# Patient Record
Sex: Male | Born: 1980 | Race: Black or African American | Hispanic: No | Marital: Single | Smoking: Current every day smoker
Health system: Southern US, Community
[De-identification: ages and names within clinical notes are randomized; demographics above are authoritative.]

## PROBLEM LIST (undated history)

## (undated) DIAGNOSIS — S83106A Unspecified dislocation of unspecified knee, initial encounter: Secondary | ICD-10-CM

## (undated) HISTORY — PX: HERNIA REPAIR: SHX51

## (undated) HISTORY — PX: APPENDECTOMY: SHX54

---
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
## 2001-11-23 ENCOUNTER — Emergency Department (HOSPITAL_COMMUNITY): Admission: EM | Admit: 2001-11-23 | Discharge: 2001-11-23 | Payer: Self-pay | Admitting: Emergency Medicine

## 2002-07-22 ENCOUNTER — Emergency Department (HOSPITAL_COMMUNITY): Admission: EM | Admit: 2002-07-22 | Discharge: 2002-07-22 | Payer: Self-pay | Admitting: Emergency Medicine

## 2002-07-22 ENCOUNTER — Encounter: Payer: Self-pay | Admitting: Emergency Medicine

## 2003-05-15 ENCOUNTER — Emergency Department (HOSPITAL_COMMUNITY): Admission: EM | Admit: 2003-05-15 | Discharge: 2003-05-15 | Payer: Self-pay | Admitting: *Deleted

## 2005-05-31 ENCOUNTER — Emergency Department (HOSPITAL_COMMUNITY): Admission: EM | Admit: 2005-05-31 | Discharge: 2005-05-31 | Payer: Self-pay | Admitting: Emergency Medicine

## 2006-07-02 ENCOUNTER — Emergency Department (HOSPITAL_COMMUNITY): Admission: EM | Admit: 2006-07-02 | Discharge: 2006-07-02 | Payer: Self-pay | Admitting: Emergency Medicine

## 2006-07-25 ENCOUNTER — Emergency Department (HOSPITAL_COMMUNITY): Admission: EM | Admit: 2006-07-25 | Discharge: 2006-07-25 | Payer: Self-pay | Admitting: Emergency Medicine

## 2006-12-11 ENCOUNTER — Emergency Department (HOSPITAL_COMMUNITY): Admission: EM | Admit: 2006-12-11 | Discharge: 2006-12-11 | Payer: Self-pay | Admitting: *Deleted

## 2008-06-16 ENCOUNTER — Emergency Department (HOSPITAL_COMMUNITY): Admission: EM | Admit: 2008-06-16 | Discharge: 2008-06-16 | Payer: Self-pay | Admitting: Emergency Medicine

## 2009-02-26 ENCOUNTER — Emergency Department (HOSPITAL_COMMUNITY): Admission: EM | Admit: 2009-02-26 | Discharge: 2009-02-26 | Payer: Self-pay | Admitting: Emergency Medicine

## 2010-12-01 LAB — DIFFERENTIAL
Basophils Relative: 0
Eosinophils Absolute: 0.4
Lymphs Abs: 1.5
Monocytes Relative: 6
Neutro Abs: 10.7 — ABNORMAL HIGH
Neutrophils Relative %: 80 — ABNORMAL HIGH

## 2010-12-01 LAB — BASIC METABOLIC PANEL
CO2: 26
GFR calc Af Amer: >60
Glucose, Bld: 93
Potassium: 4

## 2010-12-01 LAB — CBC
MCHC: 33.7
MCV: 82.4
RDW: 14.9 — ABNORMAL HIGH
WBC: 13.4 — ABNORMAL HIGH

## 2011-05-12 ENCOUNTER — Encounter (HOSPITAL_COMMUNITY): Payer: Self-pay | Admitting: Emergency Medicine

## 2011-05-12 ENCOUNTER — Emergency Department (HOSPITAL_COMMUNITY)
Admission: EM | Admit: 2011-05-12 | Discharge: 2011-05-12 | Disposition: A | Discharge status: Home / Self Care | Payer: Self-pay | Attending: Emergency Medicine | Admitting: Emergency Medicine

## 2011-05-12 DIAGNOSIS — F17200 Nicotine dependence, unspecified, uncomplicated: Secondary | ICD-10-CM | POA: Insufficient documentation

## 2011-05-12 DIAGNOSIS — K089 Disorder of teeth and supporting structures, unspecified: Secondary | ICD-10-CM | POA: Insufficient documentation

## 2011-05-12 DIAGNOSIS — K0889 Other specified disorders of teeth and supporting structures: Secondary | ICD-10-CM

## 2011-05-12 HISTORY — DX: Unspecified dislocation of unspecified knee, initial encounter: S83.106A

## 2011-05-12 MED ORDER — CLINDAMYCIN HCL 150 MG PO CAPS: 450.0000 mg | ORAL_CAPSULE | Freq: Three times a day (TID) | ORAL | Status: DC

## 2011-05-12 MED ORDER — OXYCODONE-ACETAMINOPHEN 5-325 MG PO TABS: 1.0000 | ORAL_TABLET | Freq: Four times a day (QID) | ORAL | Status: AC | PRN

## 2011-05-12 NOTE — Discharge Instructions (Signed)
You have a dental injury. Use the resource guide listed below to help you find a dentist if you do not already have one to followup with. It is very important that you get evaluated by a dentist as soon as possible. Call tomorrow to schedule an appointment. Use your pain medication as prescribed and do not operate heavy machinery while on pain medication. Note that your pain medication contains acetaminophen (Tylenol) & its is not reccommended that you use additional acetaminophen (Tylenol) while taking this medication. Take your full course of antibiotics. Read the instructions below.  Eat a soft or liquid diet and rinse your mouth out after meals with warm water. You should see a dentist or return here at once if you have increased swelling, increased pain or uncontrolled bleeding from the site of your injury.   SEEK MEDICAL CARE IF:   You have increased pain not controlled with medicines.   You have swelling around your tooth, in your face or neck.   You have bleeding which starts, continues, or gets worse.   You have a fever >101  If you are unable to open your mouth  RESOURCE GUIDE  Dental Problems  Patients with Medicaid: [facility]                     [facility] [address]                                           [address] Phone:  [phone]                                                  Phone:  [phone]  If unable to pay or uninsured, contact:  [facility] or [facility] to become qualified for the adult dental clinic.  Chronic Pain Problems Contact [facility]  [phone] Patients need to be referred by their primary care doctor.  Insufficient Money for Medicine Contact United Way:  call "211" or [facility] 271-5999.  No Primary Care Doctor Call [facility]  832-8000 Other agencies that provide inexpensive medical care    [facility]  [phone]    [place]  [facility]  [phone]    [facility]  [phone]    [facility] Clinic  [phone]    [facility]  [phone]    [facility]  [phone]  Psychological Services [facility]  [phone] [facility]  [phone] [facility]   800 853-5163 (emergency services [phone])  Substance Abuse Resources [facility]  336-882-2125 [facility] 336-784-9470 [facility] 336-285-9073 [facility] 336-845-3988 Residential & Outpatient Substance Abuse Program  800-659-3381  Abuse/Neglect [place] Child Abuse Hotline (336) 641-3795 [place] [facility] Abuse Hotline 800-378-5315 (After Hours)  Emergency Shelter [facility] (336) 271-5985  [facility] (336) 275-9566 [facility] (704) 372-4663  MRSA Hotline #:   832-7006    [place] [facility]  [facility] [place]     [employer]                            [facility] [address] [place]                       [address]      [address]  [place]                                                [place]                            [place] Phone:  [phone]                                   Phone:  [phone]                 Phone:  [phone]  [facility] Phone:  [phone]  [place] Child Abuse Hotline (336) 342-1394 (336) 342-3537 (After Hours)    

## 2011-05-12 NOTE — ED Notes (Signed)
Pt has a broken/abcessed tooth in L back side of his mouth that has been increasing in pain since 3-4 days ago. States the pain is so severe he can't eat or sleep. Feels like left side of face is swelling.

## 2011-05-12 NOTE — ED Provider Notes (Signed)
History     CSN: [id number]  Arrival date & time 05/12/11  1623   First MD Initiated Contact with Patient 05/12/11 1655      Chief Complaint  Patient presents with  . Dental Problem    (Consider location/radiation/quality/duration/timing/severity/associated sxs/prior treatment) HPI Comments: Patient reports that his left lower molar tooth has been broken for months.  No injury or trauma.   Tooth pain worse over the past week.  He has taken Ibuprofen and Advil for pain, which has not helped.  He does not have a dentist.   Patient is a 31 y.o. male presenting with tooth pain. The history is provided by the patient.  Dental PainThe primary symptoms include mouth pain. Primary symptoms do not include dental injury or fever.  Additional symptoms include: gum swelling and gum tenderness. Additional symptoms do not include: purulent gums, trismus, facial swelling, trouble swallowing, drooling and ear pain.    Past Medical History  Diagnosis Date  . Dislocated knee     Past Surgical History  Procedure Date  . Hernia repair   . Appendectomy     No family history on file.  History  Substance Use Topics  . Smoking status: Current Everyday Smoker -- 0.5 packs/day  . Smokeless tobacco: Not on file  . Alcohol Use: No      Review of Systems  Constitutional: Negative for fever and chills.  HENT: Positive for dental problem. Negative for ear pain, facial swelling, drooling, trouble swallowing, neck pain, neck stiffness and voice change.   Gastrointestinal: Negative for nausea and vomiting.    Allergies  Amoxicillin  Home Medications   Current Outpatient Rx  Name Route Sig Dispense Refill  . IBUPROFEN 200 MG PO TABS Oral Take 800 mg by mouth every 6 (six) hours as needed. For pain      BP 153/89  Pulse 68  Temp(Src) 98 F (36.7 C) (Oral)  SpO2 100%  Physical Exam  Nursing note and vitals reviewed. Constitutional: He is oriented to person, place, and time. He appears  well-developed and well-nourished. No distress.  HENT:  Head: Normocephalic and atraumatic. No trismus in the jaw.  Mouth/Throat: Uvula is midline, oropharynx is clear and moist and mucous membranes are normal. Abnormal dentition. No dental abscesses or uvula swelling. No oropharyngeal exudate, posterior oropharyngeal edema, posterior oropharyngeal erythema or tonsillar abscesses.       Poor dental hygiene. Pt able to open and close mouth with out difficulty. Airway intact. Uvula midline. Mild gingival swelling with tenderness over affected area, but no fluctuance. No swelling or tenderness of submental and submandibular regions.  Eyes: Conjunctivae and EOM are normal.  Neck: Normal range of motion and full passive range of motion without pain. Neck supple.  Cardiovascular: Normal rate and regular rhythm.   Pulmonary/Chest: Effort normal and breath sounds normal. No stridor. No respiratory distress. He has no wheezes.  Musculoskeletal: Normal range of motion.  Lymphadenopathy:       Head (right side): No submental, no submandibular, no tonsillar, no preauricular and no posterior auricular adenopathy present.       Head (left side): No submental, no submandibular, no tonsillar, no preauricular and no posterior auricular adenopathy present.    He has no cervical adenopathy.  Neurological: He is alert and oriented to person, place, and time.  Skin: Skin is warm and dry. No rash noted. He is not diaphoretic.    ED Course  Procedures (including critical care time)  Labs Reviewed - No data  to display No results found.   1. Pain, dental       MDM  Patient with toothache.  No gross abscess.  Exam unconcerning for Ludwig's angina or spread of infection.  Will treat with penicillin and pain medicine.  Urged patient to follow-up with dentist.          Pascal [address], PA-C 05/13/11 [phone]

## 2011-05-14 NOTE — ED Provider Notes (Signed)
Medical screening examination/treatment/procedure(s) were performed by non-physician practitioner and as supervising physician I was immediately available for consultation/collaboration.  Franci Oshana K Linker, MD 05/14/11 0713 

## 2011-05-16 ENCOUNTER — Emergency Department (HOSPITAL_COMMUNITY)
Admission: EM | Admit: 2011-05-16 | Discharge: 2011-05-16 | Disposition: A | Discharge status: Home / Self Care | Payer: Self-pay | Attending: Emergency Medicine | Admitting: Emergency Medicine

## 2011-05-16 DIAGNOSIS — F17200 Nicotine dependence, unspecified, uncomplicated: Secondary | ICD-10-CM | POA: Insufficient documentation

## 2011-05-16 DIAGNOSIS — K029 Dental caries, unspecified: Secondary | ICD-10-CM | POA: Insufficient documentation

## 2011-05-16 DIAGNOSIS — K0889 Other specified disorders of teeth and supporting structures: Secondary | ICD-10-CM

## 2011-05-16 MED ORDER — TRAMADOL HCL 50 MG PO TABS: 50.0000 mg | ORAL_TABLET | Freq: Four times a day (QID) | ORAL | Status: DC | PRN

## 2011-05-16 MED ORDER — CEPHALEXIN 500 MG PO CAPS: 500.0000 mg | ORAL_CAPSULE | Freq: Four times a day (QID) | ORAL | Status: DC

## 2011-05-16 NOTE — ED Provider Notes (Signed)
History     CSN: [id number]  Arrival date & time 05/16/11  1522   First MD Initiated Contact with Patient 05/16/11 1614      Chief Complaint  Patient presents with  . Dental Pain    (Consider location/radiation/quality/duration/timing/severity/associated sxs/prior treatment) HPI  Dental Periodontal Exam  . Patient presents to the emergency department with complaint of a toothache. The patient was seen a couple of days ago and given a Rx for pain medication and antibiotics but was not given a physical paper Rx for the abx and did not realize that their was won. He has finished his pain medication but the pain is still there. He has called Dr. Leanord Asal office but it is going to take another week before his appointment. He comes requesting more pain medication. He denies difficulty breathing or handling secretions. He denies lock jaw, chest pains, myalgias and fevers.  Past Medical History  Diagnosis Date  . Dislocated knee     Past Surgical History  Procedure Date  . Hernia repair   . Appendectomy     No family history on file.  History  Substance Use Topics  . Smoking status: Current Everyday Smoker -- 0.5 packs/day  . Smokeless tobacco: Not on file  . Alcohol Use: No      Review of Systems  Allergies  Amoxicillin  Home Medications   Current Outpatient Rx  Name Route Sig Dispense Refill  . IBUPROFEN 200 MG PO TABS Oral Take 800 mg by mouth every 8 (eight) hours as needed. For pain    . OXYCODONE-ACETAMINOPHEN 5-325 MG PO TABS Oral Take 1-2 tablets by mouth every 6 (six) hours as needed for pain. 15 tablet 0  . CEPHALEXIN 500 MG PO CAPS Oral Take 1 capsule (500 mg total) by mouth 4 (four) times daily. 28 capsule 0  . TRAMADOL HCL 50 MG PO TABS Oral Take 1 tablet (50 mg total) by mouth every 6 (six) hours as needed for pain. 15 tablet 0    BP 146/75  Pulse 71  Temp(Src) 97.9 F (36.6 C) (Oral)  Resp 16  Physical Exam  Nursing note and vitals  reviewed. Constitutional: He appears well-developed and well-nourished.  HENT:  Head: Normocephalic and atraumatic. No trismus in the jaw.  Mouth/Throat: Oropharynx is clear and moist. No oral lesions. Dental caries present. No uvula swelling.    Eyes: Conjunctivae and EOM are normal. Pupils are equal, round, and reactive to light.  Neck: Normal range of motion. Neck supple.  Cardiovascular: Normal rate and regular rhythm.   Pulmonary/Chest: Effort normal and breath sounds normal.    ED Course  Procedures (including critical care time)  Labs Reviewed - No data to display No results found.   1. Toothache       MDM  Pt has allergy to amoxicillin and penicillin will give keflex. Also gave pt Rx for Tramadol.  Pt has been advised of the symptoms that warrant their return to the ED. Patient has voiced understanding and has agreed to follow-up with the PCP or specialist.         Dorthula Matas, PA 05/16/11 [phone]

## 2011-05-16 NOTE — Discharge Instructions (Signed)
Dental Pain A tooth ache may be caused by cavities (tooth decay). Cavities expose the nerve of the tooth to air and hot or cold temperatures. It may come from an infection or abscess (also called a boil or furuncle) around your tooth. It is also often caused by dental caries (tooth decay). This causes the pain you are having. DIAGNOSIS  Your caregiver can diagnose this problem by exam. TREATMENT   If caused by an infection, it may be treated with medications which kill germs (antibiotics) and pain medications as prescribed by your caregiver. Take medications as directed.   Only take over-the-counter or prescription medicines for pain, discomfort, or fever as directed by your caregiver.   Whether the tooth ache today is caused by infection or dental disease, you should see your dentist as soon as possible for further care.  SEEK MEDICAL CARE IF: The exam and treatment you received today has been provided on an emergency basis only. This is not a substitute for complete medical or dental care. If your problem worsens or new problems (symptoms) appear, and you are unable to meet with your dentist, call or return to this location. SEEK IMMEDIATE MEDICAL CARE IF:   You have a fever.   You develop redness and swelling of your face, jaw, or neck.   You are unable to open your mouth.   You have severe pain uncontrolled by pain medicine.  MAKE SURE YOU:   Understand these instructions.   Will watch your condition.   Will get help right away if you are not doing well or get worse.  Document Released: 02/07/2005 Document Revised: 01/27/2011 Document Reviewed: 09/26/2007 [employer] Patient Information 2012 [employer]  RESOURCE GUIDE  Dental Problems  Patients with Medicaid: [facility]                     [facility] [address]                                           [address] Phone:  [phone]                                                   Phone:  [phone]  If unable to pay or uninsured, contact:  [facility] or [facility] to become qualified for the adult dental clinic.  Chronic Pain Problems Contact [facility]  [phone] Patients need to be referred by their primary care doctor.  Insufficient Money for Medicine Contact United Way:  call "211" or [facility] 271-5999.  No Primary Care Doctor Call [facility]  832-8000 Other agencies that provide inexpensive medical care    [facility]  [phone]    [facility]  [phone]    [facility]  [phone]    [facility] Clinic  [phone]    [facility]  [phone]    [facility]  [phone]  Psychological Services [facility]  [phone] [facility]  [phone] [facility]   [phone] (emergency services [phone])  Substance Abuse Resources [facility]  336-882-2125 [facility] 336-784-9470 [facility] 336-285-9073 [facility]   336-845-3988 Residential & Outpatient Substance Abuse Program  800-659-3381  Abuse/Neglect [place] Child Abuse Hotline (336) 641-3795 [place] [facility] Hotline 800-378-5315 (After Hours)  Emergency Shelter [facility] (336) 271-5985  [facility] (336) 275-9566 [facility] (704) 372-4663  MRSA Hotline #:   832-7006    [place] Resources  [facility] [place]     [employer]                          [facility] [address] [place]                       [address]      [address]  [place]                                                [place]                            [place] Phone:  [phone]                                   Phone:  [phone]                 Phone:  [phone]  [facility] Phone:   [phone]  [place] Child Abuse Hotline (336) 342-1394 (336) 342-3537 (After Hours)   

## 2011-05-16 NOTE — Progress Notes (Signed)
ED CM confirms he does not have a pcp and is self pay He has not completed [employer] forms offered by [facility] community liasion on 05/12/11. Pt states he only took percocets for pain and not antibiotics after 05/12/11 ED visit.  Reporting he did not know a Rx for antibiotics had to be called into his pharmacy. Pt is planning to enroll in [employer] in Fall 2013 CM discussed obtaining [facility] health program.  Pt states he will complete [facility] forms and/or look on the list of self pay [place] pcp provided by CM.  He will go be going to his dental appointment at [facility] [address]  Cm provided pt with a list of other self pay dental services also

## 2011-05-16 NOTE — ED Notes (Signed)
Pt stated he was seen here at [facility] for toothache and treated with percocet and a anti inflammatory medication.  Pt stated tooth pain has increased and tooth pain is 10/10.

## 2011-05-16 NOTE — ED Notes (Signed)
MD at bedside. 

## 2011-05-17 ENCOUNTER — Emergency Department (HOSPITAL_COMMUNITY)
Admission: EM | Admit: 2011-05-17 | Discharge: 2011-05-17 | Disposition: A | Discharge status: Home / Self Care | Payer: Self-pay | Attending: Emergency Medicine | Admitting: Emergency Medicine

## 2011-05-17 ENCOUNTER — Encounter (HOSPITAL_COMMUNITY): Payer: Self-pay | Admitting: Emergency Medicine

## 2011-05-17 DIAGNOSIS — K029 Dental caries, unspecified: Secondary | ICD-10-CM | POA: Insufficient documentation

## 2011-05-17 DIAGNOSIS — F17200 Nicotine dependence, unspecified, uncomplicated: Secondary | ICD-10-CM | POA: Insufficient documentation

## 2011-05-17 MED ORDER — OXYCODONE-ACETAMINOPHEN 5-325 MG PO TABS: 1.0000 | ORAL_TABLET | Freq: Four times a day (QID) | ORAL | Status: DC | PRN

## 2011-05-17 NOTE — Discharge Instructions (Signed)
Dental Caries  Tooth decay (dental caries, cavities) is the most common of all oral diseases. It occurs in all ages but is more common in children and young adults.  CAUSES  Bacteria in your mouth combine with foods (particularly sugars and starches) to produce plaque. Plaque is a substance that sticks to the hard surfaces of teeth. The bacteria in the plaque produce acids that attack the enamel of teeth. Repeated acid attacks dissolve the enamel and create holes in the teeth. Root surfaces of teeth may also get these holes.  Other contributing factors include:   Frequent snacking and drinking of cavity-producing foods and liquids.   Poor oral hygiene.   Dry mouth.   Substance abuse such as methamphetamine.   Broken or poor fitting dental restorations.   Eating disorders.   Gastroesophageal reflux disease (GERD).   Certain radiation treatments to the head and neck.  SYMPTOMS  At first, dental decay appears as white, chalky areas on the enamel. In this early stage, symptoms are seldom present. As the decay progresses, pits and holes may appear on the enamel surfaces. Progression of the decay will lead to softening of the hard layers of the tooth. At this point you may experience some pain or achy feeling after sweet, hot, or cold foods or drinks are consumed. If left untreated, the decay will reach the internal structures of the tooth and produce severe pain. Extensive dental treatment, such as root canal therapy, may be needed to save the tooth at this late stage of decay development.  DIAGNOSIS  Most cavities will be detected during regular check-ups. A thorough medical and dental history will be taken by the dentist. The dentist will use instruments to check the surfaces of your teeth for any breakdown or discoloration. Some dentists have special instruments, such as lasers, that detect tooth decay. Dental X-rays may also show some cavities that are not visible to the eye (such as between  the contact areas of the teeth). TREATMENT  Treatment involves removal of the tooth decay and replacement with a restorative material such as silver, gold, or composite (white) material. However, if the decay involves a large area of the tooth and there is little remaining healthy tooth structure, a cap (crown) will be fitted over the remaining structure. If the decay involves the center part of the tooth (pulp), root canal treatment will be needed before any type of dental restoration is placed. If the tooth is severely destroyed by the decay process, leaving the remaining tooth structures unrestorable, the tooth will need to be pulled (extracted). Some early tooth decay may be reversed by fluoride treatments and thorough brushing and flossing at home. PREVENTION   Eat healthy foods. Restrict the amount of sugary, starchy foods and liquids you consume. Avoid frequent snacking and drinking of unhealthy foods and liquids.   Sealants can help with prevention of cavities. Sealants are composite resins applied onto the biting surfaces of teeth at risk for decay. They smooth out the pits and grooves and prevent food from being trapped in them. This is done in early childhood before tooth decay has started.   Fluoride tablets may also be prescribed to children between 6 months and 10 years of age if your drinking water is not fluoridated. The fluoride absorbed by the tooth enamel makes teeth less susceptible to decay. Thorough daily cleaning with a toothbrush and dental floss is the best way to prevent cavities. Use of a fluoride toothpaste is highly recommended. Fluoride mouth rinses   may be used in specific cases.   Topical application of fluoride by your dentist is important in children.   Regular visits with a dentist for checkups and cleanings are also important.  SEEK IMMEDIATE DENTAL CARE IF:  You have a fever.   You develop redness and swelling of your face, jaw, or neck.   You develop swelling  around a tooth.   You are unable to open your mouth or cannot swallow.   You have severe pain uncontrolled by pain medicine.  Document Released: 10/30/2001 Document Revised: 01/27/2011 Document Reviewed: 07/15/2010 [facility] Patient Information 2012 [place], [place]. Make sure to call the dentist referral first thing in the morning

## 2011-05-17 NOTE — ED Provider Notes (Signed)
History     CSN: [id number]  Arrival date & time 05/17/11  1751   First MD Initiated Contact with Patient 05/17/11 1952      Chief Complaint  Patient presents with  . Dental Pain    (Consider location/radiation/quality/duration/timing/severity/associated sxs/prior treatment) HPI Comments: Patient states he is continuing dental, pain.  He's been seen here twice before.  Has not followed up with the dentist states he tries calling, but they don't call him back, the first time.  He was given clindamycin and Percocet and that helped his pain tremendously.  Yesterday.  He was given Ultram, and it's not doing anything for his pain  Patient is a 31 y.o. male presenting with tooth pain. The history is provided by the patient.  Dental PainThe primary symptoms include mouth pain. Primary symptoms do not include fever. The symptoms are unchanged. The symptoms occur constantly.    Past Medical History  Diagnosis Date  . Dislocated knee     Past Surgical History  Procedure Date  . Hernia repair   . Appendectomy     No family history on file.  History  Substance Use Topics  . Smoking status: Current Everyday Smoker -- 0.5 packs/day  . Smokeless tobacco: Not on file  . Alcohol Use: No      Review of Systems  Constitutional: Negative for fever.  HENT: Positive for dental problem.     Allergies  Amoxicillin  Home Medications   Current Outpatient Rx  Name Route Sig Dispense Refill  . CEPHALEXIN 500 MG PO CAPS Oral Take 1 capsule (500 mg total) by mouth 4 (four) times daily. 28 capsule 0  . IBUPROFEN 200 MG PO TABS Oral Take 800 mg by mouth every 8 (eight) hours as needed. For pain    . OXYCODONE-ACETAMINOPHEN 5-325 MG PO TABS Oral Take 1-2 tablets by mouth every 6 (six) hours as needed for pain. 15 tablet 0  . TRAMADOL HCL 50 MG PO TABS Oral Take 1 tablet (50 mg total) by mouth every 6 (six) hours as needed for pain. 15 tablet 0  . OXYCODONE-ACETAMINOPHEN 5-325 MG PO TABS  Oral Take 1-2 tablets by mouth every 6 (six) hours as needed for pain. 11 tablet 0    BP 146/80  Pulse 65  Temp(Src) 97.9 F (36.6 C) (Oral)  Resp 18  Ht 5\' 11"  (1.803 m)  Wt 183 lb (83.008 kg)  BMI 25.52 kg/m2  SpO2 100%  Physical Exam  Constitutional: He appears well-developed and well-nourished.  HENT:       Upper left last molar is nothing but a shell with extensive decay.  Minimal gum swelling  Eyes: Pupils are equal, round, and reactive to light.  Neck: Normal range of motion.  Pulmonary/Chest: Effort normal.  Musculoskeletal: Normal range of motion.  Skin: Skin is warm.    ED Course  Procedures (including critical care time)  Labs Reviewed - No data to display No results found.   1. Dental caries       MDM  Dental caries        Arman Filter, NP 05/17/11 2033  Arman Filter, NP 05/18/11 1501

## 2011-05-17 NOTE — ED Provider Notes (Signed)
Medical screening examination/treatment/procedure(s) were performed by non-physician practitioner and as supervising physician I was immediately available for consultation/collaboration.    Naoma Boxell R Kynzee Devinney, MD 05/17/11 0045 

## 2011-05-17 NOTE — ED Notes (Signed)
Pt. With left upper tooth pain for over a week.  Was seen here yesterday and received antibiotic and tramadol.  Tramadol has not been successful in relieving patient's pain.  Pt. Rates pain as a 10.

## 2011-05-18 NOTE — ED Provider Notes (Signed)
Medical screening examination/treatment/procedure(s) were performed by non-physician practitioner and as supervising physician I was immediately available for consultation/collaboration.  Raeford Razor, MD 05/18/11 [phone]

## 2011-05-24 ENCOUNTER — Emergency Department (HOSPITAL_COMMUNITY)
Admission: EM | Admit: 2011-05-24 | Discharge: 2011-05-24 | Disposition: A | Discharge status: Home / Self Care | Payer: Self-pay | Attending: Emergency Medicine | Admitting: Emergency Medicine

## 2011-05-24 ENCOUNTER — Encounter (HOSPITAL_COMMUNITY): Payer: Self-pay

## 2011-05-24 DIAGNOSIS — K0889 Other specified disorders of teeth and supporting structures: Secondary | ICD-10-CM

## 2011-05-24 DIAGNOSIS — K089 Disorder of teeth and supporting structures, unspecified: Secondary | ICD-10-CM | POA: Insufficient documentation

## 2011-05-24 MED ORDER — BUPIVACAINE HCL (PF) 0.5 % IJ SOLN
INTRAMUSCULAR | Status: AC
  Administered 2011-05-24: 50 mL
  Filled 2011-05-24: qty 10

## 2011-05-24 MED ORDER — BUPIVACAINE-EPINEPHRINE PF 0.25-1:200000 % IJ SOLN
INTRAMUSCULAR | Status: AC
  Filled 2011-05-24: qty 30

## 2011-05-24 MED ORDER — BUPIVACAINE HCL 0.5 % IJ SOLN
50.0000 mL | Freq: Once | INTRAMUSCULAR | Status: AC
  Administered 2011-05-24: 50 mL
  Filled 2011-05-24: qty 50

## 2011-05-24 MED ORDER — OXYCODONE-ACETAMINOPHEN 5-325 MG PO TABS: 1.0000 | ORAL_TABLET | Freq: Four times a day (QID) | ORAL | Status: AC | PRN

## 2011-05-24 NOTE — ED Notes (Signed)
Pt presents with 1 week h/o L upper tooth pain.  Pt reports hole in the tooth, reports attempting to get dentist appointment, but reports pain has worsened.  Sensitive to heat and cold. No swelling noted to jaw.

## 2011-05-24 NOTE — Discharge Instructions (Signed)
Followup with Dr. Ninetta Lights today or tomorrow for further management of dental pain.

## 2011-05-24 NOTE — ED Provider Notes (Signed)
History     CSN: [id number]  Arrival date & time 05/24/11  1254   First MD Initiated Contact with Patient 05/24/11 1317     1:44 PM HPI Alvin Anderson is a 31 y.o. male who has had multiple visits to the emergency department since 05/12/2011 do to right upper third molar pain. States pain has been persistent despite using ibuprofen and Percocet. Reports he is in a appointment with [facility] on 06/01/11. Denies purulent drainage or fever. States he only took 2 tablets of Keflex and said it didn't work so he stopped taking it.  Patient is a 31 y.o. male presenting with tooth pain. The history is provided by the patient.  Dental PainPrimary symptoms do not include headaches. The symptoms began more than 1 week ago. The symptoms are worsening. The symptoms are new. The symptoms occur constantly.  Additional symptoms include: dental sensitivity to temperature, gum tenderness and jaw pain. Additional symptoms do not include: gum swelling, purulent gums, trismus, facial swelling, trouble swallowing, pain with swallowing, ear pain and hearing loss.    Past Medical History  Diagnosis Date  . Dislocated knee     Past Surgical History  Procedure Date  . Hernia repair   . Appendectomy     No family history on file.  History  Substance Use Topics  . Smoking status: Current Everyday Smoker -- 0.5 packs/day  . Smokeless tobacco: Not on file  . Alcohol Use: No      Review of Systems  HENT: Positive for dental problem. Negative for hearing loss, ear pain, facial swelling, mouth sores, trouble swallowing and neck stiffness.        Dental caries   Eyes: Negative for photophobia and pain.  Neurological: Negative for weakness and headaches.  All other systems reviewed and are negative.    Allergies  Amoxicillin  Home Medications   Current Outpatient Rx  Name Route Sig Dispense Refill  . GOODY HEADACHE PO Oral Take 1 packet by mouth daily as needed.    . IBUPROFEN 200 MG PO  TABS Oral Take 400-800 mg by mouth every 8 (eight) hours as needed. For pain      BP 141/77  Pulse 74  Temp(Src) 98 F (36.7 C) (Oral)  Resp 18  Ht 6' (1.829 m)  Wt 183 lb (83.008 kg)  BMI 24.82 kg/m2  SpO2 98%  Physical Exam  Constitutional: He is oriented to person, place, and time. He appears well-developed and well-nourished.  HENT:  Head: Normocephalic and atraumatic.  Mouth/Throat: Uvula is midline, oropharynx is clear and moist and mucous membranes are normal.    Eyes: Pupils are equal, round, and reactive to light.  Neurological: He is alert and oriented to person, place, and time.  Skin: Skin is warm and dry. No rash noted. No erythema. No pallor.  Psychiatric: He has a normal mood and affect. His behavior is normal.    ED Course  Dental Date/Time: 05/24/2011 2:04 PM Performed by: Thomasene Lot Authorized by: Thomasene Lot Consent: Verbal consent obtained. Consent given by: patient Patient identity confirmed: verbally with patient Time out: Immediately prior to procedure a "time out" was called to verify the correct patient, procedure, equipment, support staff and site/side marked as required. Local anesthesia used: yes Anesthesia: nerve block Local anesthetic: bupivacaine 0.5% without epinephrine Patient sedated: no Patient tolerance: Patient tolerated the procedure well with no immediate complications. Comments: Dental block of infralveolar nerve      MDM  Patient reports that  he got his referral for [facility] from [place] [facility]. Advised patient that we are in agreement with dentists in the area and that the dentists have agreed to see the  patient within 48 hours. For this reason I will only give one days worth of Percocet so that he may seek a dentist today or tomorrow. Will also refer patient to Dr. Ninetta Lights advised patient should see him within one or 2 days. Advised patient that continuous use of oral narcotics can lead to addiction intolerance.  Also advised the patient to finish all his Keflex to prevent against infection. I have discussed this in great detail and explained to patient that it won't "make him feel better" but he should still take it to prevent infection.        Thomasene Lot, PA-C 05/24/11 1352  Thomasene Lot, PA-C 05/25/11 [phone]

## 2011-05-26 NOTE — ED Provider Notes (Signed)
Medical screening examination/treatment/procedure(s) were performed by non-physician practitioner and as supervising physician I was immediately available for consultation/collaboration.   Destin Vinsant A Jeanae Whitmill, MD 05/26/11 1542 

## 2011-06-21 ENCOUNTER — Emergency Department (HOSPITAL_COMMUNITY): Payer: Self-pay

## 2011-06-21 ENCOUNTER — Emergency Department (HOSPITAL_COMMUNITY)
Admission: EM | Admit: 2011-06-21 | Discharge: 2011-06-21 | Disposition: A | Discharge status: Home / Self Care | Payer: Self-pay | Attending: Emergency Medicine | Admitting: Emergency Medicine

## 2011-06-21 ENCOUNTER — Encounter (HOSPITAL_COMMUNITY): Payer: Self-pay

## 2011-06-21 DIAGNOSIS — W19XXXA Unspecified fall, initial encounter: Secondary | ICD-10-CM | POA: Insufficient documentation

## 2011-06-21 DIAGNOSIS — Y9361 Activity, american tackle football: Secondary | ICD-10-CM | POA: Insufficient documentation

## 2011-06-21 DIAGNOSIS — M25469 Effusion, unspecified knee: Secondary | ICD-10-CM | POA: Insufficient documentation

## 2011-06-21 DIAGNOSIS — S8990XA Unspecified injury of unspecified lower leg, initial encounter: Secondary | ICD-10-CM | POA: Insufficient documentation

## 2011-06-21 DIAGNOSIS — Y92009 Unspecified place in unspecified non-institutional (private) residence as the place of occurrence of the external cause: Secondary | ICD-10-CM | POA: Insufficient documentation

## 2011-06-21 DIAGNOSIS — S8992XA Unspecified injury of left lower leg, initial encounter: Secondary | ICD-10-CM

## 2011-06-21 DIAGNOSIS — S99929A Unspecified injury of unspecified foot, initial encounter: Secondary | ICD-10-CM | POA: Insufficient documentation

## 2011-06-21 MED ORDER — HYDROCODONE-ACETAMINOPHEN 5-500 MG PO TABS: 1.0000 | ORAL_TABLET | Freq: Four times a day (QID) | ORAL | Status: AC | PRN

## 2011-06-21 MED ORDER — IBUPROFEN 600 MG PO TABS: 600.0000 mg | ORAL_TABLET | Freq: Four times a day (QID) | ORAL | Status: AC | PRN

## 2011-06-21 NOTE — ED Notes (Signed)
Patient reports that he played football this past saturday and fell during the event. Pain to right shoulder with rom and pain to left knee with ambulation

## 2011-06-21 NOTE — Discharge Instructions (Signed)
Athletic Injuries Proper early treatment and rehabilitation leads to a quicker recovery for most athletic injuries. You may be able to return to your sport fully recovered in less time if you follow these general rules:   Rest. Rest the injury until movement is no longer painful. Using an injured joint or muscle will prolong the problem.   Elevate. Keep the injured area elevated until most of the swelling and pain are gone. If possible, keep the injured area above the level of your heart.   Ice. Use ice packs directly on the injury for 3 to 4 days.   Compression. Use an elastic bandage applied to your injury as directed. This will reduce swelling, although elastic wraps do not protect injured joints. More rigid splints and taping are better for this purpose.   Rehabilitation. This should begin as soon as the swelling and pain of your injury subside, and as directed by your caregiver. It includes exercises to improve joint motion and muscular strength. Occasionally special braces, splints, or orthotics are used to protect against further injury when you return to your sport.  Keeping a positive attitude will help you heal your injury more rapidly and completely. You may return to physical exercise that does not cause pain or increase the risk of re-injury or as directed. This will help maintain fitness. It will also improve your mental attitude. Do not overuse your injured extremity. This will lead to discomfort and may delay full recovery.  Document Released: 03/17/2004 Document Revised: 01/27/2011 Document Reviewed: 08/05/2008 [employer] Patient Information 2012 [employer].Knee Effusion The medical term for having fluid in your knee is effusion. This is often due to an internal derangement of the knee. This means something is wrong inside the knee. Some of the causes of fluid in the knee may be torn cartilage, a torn ligament, or bleeding into the joint from an injury. Your knee is likely more  difficult to bend and move. This is often because there is increased pain and pressure in the joint. The time it takes for recovery from a knee effusion depends on different factors, including:   Type of injury.   Your age.   Physical and medical conditions.   Rehabilitation Strategies.  How long you will be away from your normal activities will depend on what kind of knee problem you have and how much damage is present. Your knee has two types of cartilage. Articular cartilage covers the bone ends and lets your knee bend and move smoothly. Two menisci, thick pads of cartilage that form a rim inside the joint, help absorb shock and stabilize your knee. Ligaments bind the bones together and support your knee joint. Muscles move the joint, help support your knee, and take stress off the joint itself. CAUSES  Often an effusion in the knee is caused by an injury to one of the menisci. This is often a tear in the cartilage. Recovery after a meniscus injury depends on how much meniscus is damaged and whether you have damaged other knee tissue. Small tears may heal on their own with conservative treatment. Conservative means rest, limited weight bearing activity and muscle strengthening exercises. Your recovery may take up to 6 weeks.  TREATMENT  Larger tears may require surgery. Meniscus injuries may be treated during arthroscopy. Arthroscopy is a procedure in which your surgeon uses a small telescope like instrument to look in your knee. Your caregiver can make a more accurate diagnosis (learning what is wrong) by performing an arthroscopic procedure.   If your injury is on the inner margin of the meniscus, your surgeon may trim the meniscus back to a smooth rim. In other cases your surgeon will try to repair a damaged meniscus with stitches (sutures). This may make rehabilitation take longer, but may provide better long term result by helping your knee keep its shock absorption capabilities. Ligaments which  are completely torn usually require surgery for repair. HOME CARE INSTRUCTIONS  Use crutches as instructed.   If a brace is applied, use as directed.   Once you are home, an ice pack applied to your swollen knee may help with discomfort and help decrease swelling.   Keep your knee raised (elevated) when you are not up and around or on crutches.   Only take over-the-counter or prescription medicines for pain, discomfort, or fever as directed by your caregiver.   Your caregivers will help with instructions for rehabilitation of your knee. This often includes strengthening exercises.   You may resume a normal diet and activities as directed.  SEEK MEDICAL CARE IF:   There is increased swelling in your knee.   You notice redness, swelling, or increasing pain in your knee.   An unexplained oral temperature above 102 F (38.9 C) develops.  SEEK IMMEDIATE MEDICAL CARE IF:   You develop a rash.   You have difficulty breathing.   You have any allergic reactions from medications you may have been given.   There is severe pain with any motion of the knee.  MAKE SURE YOU:   Understand these instructions.   Will watch your condition.   Will get help right away if you are not doing well or get worse.  Document Released: 04/30/2003 Document Revised: 01/27/2011 Document Reviewed: 07/04/2007 [employer] Patient Information 2012 [employer]. 

## 2011-06-21 NOTE — ED Provider Notes (Signed)
History     CSN: [id number]  Arrival date & time 06/21/11  1546   First MD Initiated Contact with Patient 06/21/11 1749      Chief Complaint  Patient presents with  . Knee Injury    (Consider location/radiation/quality/duration/timing/severity/associated sxs/prior treatment) HPI  31 year old male presents with a chief complaints of pain to his right shoulder and left knee. Patient states he was playing football 3 days ago and fell during the event.  Sts he knee twisted inward from the fall, and he was also hit by another player to his R shoulder.  Pt has been noticing swelling to his L knee and pain worseing with movement, flexing or walking.  Pain improves with rest and with ibuprofen.  He denies L hip or ankle pain. Sts he's able to ambulate.  His right shoulder is tender with ROM, however sts he has normal range of motion.  Describe pain as a tightness sensation, worsening with forward arm movement.  Denies R elbow or neck pain.  Denies numbness or weakness.    Past Medical History  Diagnosis Date  . Dislocated knee     Past Surgical History  Procedure Date  . Hernia repair   . Appendectomy     No family history on file.  History  Substance Use Topics  . Smoking status: Current Everyday Smoker -- 0.5 packs/day  . Smokeless tobacco: Not on file  . Alcohol Use: No      Review of Systems  All other systems reviewed and are negative.    Allergies  Amoxicillin  Home Medications   Current Outpatient Rx  Name Route Sig Dispense Refill  . GOODY HEADACHE PO Oral Take 1 packet by mouth daily as needed. For headache    . IBUPROFEN 200 MG PO TABS Oral Take 400-800 mg by mouth every 8 (eight) hours as needed. For pain      BP 139/82  Pulse 82  Temp 98.3 F (36.8 C)  Resp 18  SpO2 99%  Physical Exam  Nursing note and vitals reviewed. Constitutional: He appears well-developed and well-nourished.  HENT:  Head: Atraumatic.  Eyes: Conjunctivae are normal.  Neck:  Normal range of motion. Neck supple.  Musculoskeletal:       Right shoulder: Diffuse mild tenderness on palpation. No tenderness to bony prominence. Full range of motion. Normal strength. Normal sensation. No deformity.  Left knee: Moderate edema noted to medial aspects of the knee, with tenderness on palpation. No obvious laxity noted. Increased pain with flexion, extension, varus, and valgus maneuver. No rash. No obvious deformity.  Neurological: He is alert.  Skin: Skin is warm. No rash noted.    ED Course  Procedures (including critical care time)  Labs Reviewed - No data to display No results found.   No diagnosis found.  Dg Knee Complete 4 Views Left  06/21/2011  *RADIOLOGY REPORT*  Clinical Data: Twisted left knee playing football, now with medial knee pain, history of patellar dislocation  LEFT KNEE - COMPLETE 4+ VIEW  Comparison: Left knee radiographs - 02/26/2009  Findings:  No fracture or dislocation.  Moderate sized suprapatellar joint effusion.  Post-traumatic deformity of the inferomedial aspect of the patella likely the sequela reported history of prior patellar dislocation.  Moderate patellofemoral joint degenerative change with articular surface irregularity of the under surface the patella.  There is mild spurring of the tibial spines.  No evidence of chondrocalcinosis.  Regional soft tissues are normal.  IMPRESSION: 1.  Moderate sized joint  effusion without acute fracture. 2.  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  Original Report Authenticated By: Waynard Reeds, M.D.      MDM  L knee with moderate swelling, will xray.  R shoulder with FROM, no tenderness to bony prominence, likely muscle strain, and less likely dislocation, or fracture.     6:47 PM L knee xray shows moderated size joint effusion without acute fx.  Knee sleeves and crutches given.  Referral to ortho.   Pain medication prescribed.  Care instruction given.  Pt voice understanding and agrees with plan.          [name], PA-C 06/21/11 1849

## 2011-06-22 NOTE — ED Provider Notes (Signed)
Medical screening examination/treatment/procedure(s) were performed by non-physician practitioner and as supervising physician I was immediately available for consultation/collaboration.  Toy Baker, MD 06/22/11 1620

## 2012-08-02 ENCOUNTER — Encounter (HOSPITAL_COMMUNITY): Payer: Self-pay | Admitting: Emergency Medicine

## 2012-08-02 ENCOUNTER — Emergency Department (HOSPITAL_COMMUNITY)
Admission: EM | Admit: 2012-08-02 | Discharge: 2012-08-02 | Disposition: A | Discharge status: Home / Self Care | Payer: Self-pay | Attending: Emergency Medicine | Admitting: Emergency Medicine

## 2012-08-02 ENCOUNTER — Emergency Department (HOSPITAL_COMMUNITY): Payer: Self-pay

## 2012-08-02 DIAGNOSIS — Y9389 Activity, other specified: Secondary | ICD-10-CM | POA: Insufficient documentation

## 2012-08-02 DIAGNOSIS — S93402A Sprain of unspecified ligament of left ankle, initial encounter: Secondary | ICD-10-CM

## 2012-08-02 DIAGNOSIS — Y929 Unspecified place or not applicable: Secondary | ICD-10-CM | POA: Insufficient documentation

## 2012-08-02 DIAGNOSIS — Z8781 Personal history of (healed) traumatic fracture: Secondary | ICD-10-CM | POA: Insufficient documentation

## 2012-08-02 DIAGNOSIS — S93409A Sprain of unspecified ligament of unspecified ankle, initial encounter: Secondary | ICD-10-CM | POA: Insufficient documentation

## 2012-08-02 DIAGNOSIS — X500XXA Overexertion from strenuous movement or load, initial encounter: Secondary | ICD-10-CM | POA: Insufficient documentation

## 2012-08-02 MED ORDER — NAPROXEN 500 MG PO TABS: 500.0000 mg | ORAL_TABLET | Freq: Two times a day (BID) | ORAL | Status: DC

## 2012-08-02 MED ORDER — OXYCODONE-ACETAMINOPHEN 5-325 MG PO TABS
2.0000 | ORAL_TABLET | Freq: Once | ORAL | Status: AC
  Administered 2012-08-02: 2 via ORAL
  Filled 2012-08-02: qty 2

## 2012-08-02 NOTE — ED Notes (Signed)
Pt states that he stepped off a crub and has pain and swelling to lt ankle.

## 2012-08-02 NOTE — ED Notes (Signed)
John ortho tech in room

## 2012-08-02 NOTE — ED Provider Notes (Signed)
History  This chart was scribed for non-physician practitioner Arthor Captain, PA-C working with Richardean Canal, MD, by Candelaria Stagers, ED Scribe. This patient was seen in room WTR5/WTR5 and the patient's care was started at 6:50 PM   CSN: [id number]  Arrival date & time 08/02/12  1649   First MD Initiated Contact with Patient 08/02/12 1751      Chief Complaint  Patient presents with  . Ankle Pain     The history is provided by the patient. No language interpreter was used.   HPI Comments: Alvin Anderson is a 32 y.o. male who presents to the Emergency Department complaining of sudden onset of throbbing left ankle pain after he stepped off a curb and rolled his ankle outward earlier today.  He describes the pain as 9/10.  He is experiencing swelling to the ankle.  Pt reports bearing weight makes the pain worse.  He has taken nothing for the pain.     Past Medical History  Diagnosis Date  . Dislocated knee     Past Surgical History  Procedure Laterality Date  . Hernia repair    . Appendectomy      No family history on file.  History  Substance Use Topics  . Smoking status: Current Every Day Smoker -- 0.50 packs/day  . Smokeless tobacco: Not on file  . Alcohol Use: No      Review of Systems  Musculoskeletal: Positive for arthralgias (left ankle pain).  All other systems reviewed and are negative.    Allergies  Amoxicillin  Home Medications  No current outpatient prescriptions on file.  BP 134/77  Pulse 77  Temp(Src) 98 F (36.7 C) (Oral)  Resp 22  SpO2 100%  Physical Exam  Nursing note and vitals reviewed. Constitutional: He is oriented to person, place, and time. He appears well-developed and well-nourished. No distress.  HENT:  Head: Normocephalic and atraumatic.  Eyes: EOM are normal.  Neck: Neck supple. No tracheal deviation present.  Cardiovascular: Normal rate.   Pedal pulses intact.   Pulmonary/Chest: Effort normal. No respiratory distress.   Musculoskeletal: Normal range of motion.  Tenderness and swelling over lateral malleolus.  ROM limited due to pain.   Neurological: He is alert and oriented to person, place, and time.  Sensation intact.  Skin: Skin is warm and dry.  Psychiatric: He has a normal mood and affect. His behavior is normal.    ED Course  Procedures   DIAGNOSTIC STUDIES: Oxygen Saturation is 100% on room air, normal by my interpretation.    COORDINATION OF CARE:  6:55 PM Discussed course of care with pt which includes pain medication and CAM walker.  Discussed negative images with pt.  Advised follow up with orthopaedist.  Pt understands and agrees.   Labs Reviewed - No data to display Dg Ankle Complete Left  08/02/2012   *RADIOLOGY REPORT*  Clinical Data: Ankle injury with pain  LEFT ANKLE COMPLETE - 3+ VIEW  Comparison: 05/15/2003  Findings: Normal alignment.  No acute fracture.  Tiny calcification distal to the tip of the fibula is unchanged and may be due to old injury.  Ankle mortise is intact.  No significant effusion.  IMPRESSION: Negative for acute fracture.   Original Report Authenticated By: Janeece Riggers, M.D.     1. Ankle sprain, left, initial encounter       MDM  Patient wit acute ankle sprain. Patient X-Ray negative for obvious fracture or dislocation. Pain managed in ED. Pt advised to  follow up with orthopedics if symptoms persist for possibility of missed fracture diagnosis. Patient given brace while in ED, conservative therapy recommended and discussed. Patient will be dc home & is agreeable with above plan.       I personally performed the services described in this documentation, which was scribed in my presence. The recorded information has been reviewed and is accurate.          Arthor Captain, PA-C 08/03/12 1611

## 2012-08-04 NOTE — ED Provider Notes (Signed)
Medical screening examination/treatment/procedure(s) were performed by non-physician practitioner and as supervising physician I was immediately available for consultation/collaboration.   David H Yao, MD 08/04/12 1455 

## 2012-11-21 ENCOUNTER — Encounter (HOSPITAL_COMMUNITY): Payer: Self-pay | Admitting: Emergency Medicine

## 2012-11-21 ENCOUNTER — Emergency Department (HOSPITAL_COMMUNITY)
Admission: EM | Admit: 2012-11-21 | Discharge: 2012-11-21 | Disposition: A | Discharge status: Home / Self Care | Payer: Self-pay | Attending: Emergency Medicine | Admitting: Emergency Medicine

## 2012-11-21 DIAGNOSIS — Z791 Long term (current) use of non-steroidal anti-inflammatories (NSAID): Secondary | ICD-10-CM | POA: Insufficient documentation

## 2012-11-21 DIAGNOSIS — H9210 Otorrhea, unspecified ear: Secondary | ICD-10-CM | POA: Insufficient documentation

## 2012-11-21 DIAGNOSIS — F17200 Nicotine dependence, unspecified, uncomplicated: Secondary | ICD-10-CM | POA: Insufficient documentation

## 2012-11-21 DIAGNOSIS — R51 Headache: Secondary | ICD-10-CM | POA: Insufficient documentation

## 2012-11-21 DIAGNOSIS — Z87828 Personal history of other (healed) physical injury and trauma: Secondary | ICD-10-CM | POA: Insufficient documentation

## 2012-11-21 DIAGNOSIS — H9209 Otalgia, unspecified ear: Secondary | ICD-10-CM | POA: Insufficient documentation

## 2012-11-21 DIAGNOSIS — H60399 Other infective otitis externa, unspecified ear: Secondary | ICD-10-CM | POA: Insufficient documentation

## 2012-11-21 DIAGNOSIS — H6002 Abscess of left external ear: Secondary | ICD-10-CM

## 2012-11-21 DIAGNOSIS — Z880 Allergy status to penicillin: Secondary | ICD-10-CM | POA: Insufficient documentation

## 2012-11-21 MED ORDER — HYDROCODONE-ACETAMINOPHEN 5-325 MG PO TABS
2.0000 | ORAL_TABLET | Freq: Once | ORAL | Status: AC
  Administered 2012-11-21: 2 via ORAL
  Filled 2012-11-21: qty 2

## 2012-11-21 NOTE — ED Provider Notes (Signed)
CSN: [id number]     Arrival date & time 11/21/12  2122 History   This chart was scribed for Antony Madura, PA, working with Celene Kras, MD by Blanchard Kelch, ED Scribe. This patient was seen in room WTR9/WTR9 and the patient's care was started at 9:50 PM.    Chief Complaint  Patient presents with  . Facial Pain  . Abscess    Patient is a 32 y.o. male presenting with abscess. The history is provided by the patient. No language interpreter was used.  Abscess Location:  Head/neck Head/neck abscess location:  L ear Abscess quality: draining and painful   Duration:  4 days Progression:  Worsening Pain details:    Quality:  Throbbing   Duration:  4 days   Timing:  Constant Chronicity:  New Relieved by:  NSAIDs (ice) Associated symptoms: no fever   Risk factors: no hx of MRSA and no prior abscess     HPI Comments: Alvin Anderson is a 32 y.o. male who presents to the Emergency Department due to a constant, worsening abscess on his left ear that began about four days ago. He reports ear drainage from inside the canal. He has constant pain to the affected area that radiates down his face. He describes the pain as throbbing. He has been taking OTC Naproxen and using ice with mild relief. He denies fever, neck pain or hearing changes. He denies a history of abscesses, MRSA or staff infections.    Past Medical History  Diagnosis Date  . Dislocated knee    Past Surgical History  Procedure Laterality Date  . Hernia repair    . Appendectomy     History reviewed. No pertinent family history. History  Substance Use Topics  . Smoking status: Current Every Day Smoker -- 0.50 packs/day  . Smokeless tobacco: Not on file  . Alcohol Use: No    Review of Systems  Constitutional: Negative for fever.  HENT: Positive for ear pain and ear discharge. Negative for hearing loss and neck pain.   All other systems reviewed and are negative.    Allergies  Amoxicillin  Home Medications    Current Outpatient Rx  Name  Route  Sig  Dispense  Refill  . naproxen (NAPROSYN) 500 MG tablet   Oral   Take 1 tablet (500 mg total) by mouth 2 (two) times daily with a meal.   20 tablet   0    Triage Vitals: BP 146/85  Pulse 87  Temp(Src) 98.6 F (37 C) (Oral)  Resp 16  Ht 6' (1.829 m)  Wt 180 lb (81.647 kg)  BMI 24.41 kg/m2  SpO2 100%  Physical Exam  Nursing note and vitals reviewed. Constitutional: He is oriented to person, place, and time. He appears well-developed and well-nourished. No distress.  HENT:  Head: Normocephalic and atraumatic.  Right Ear: Tympanic membrane, external ear and ear canal normal. No drainage or swelling. No mastoid tenderness. No decreased hearing is noted.  Left Ear: Tympanic membrane, external ear and ear canal normal. No drainage or swelling. No mastoid tenderness. No decreased hearing is noted.  Nose: Nose normal.  Mouth/Throat: Uvula is midline, oropharynx is clear and moist and mucous membranes are normal.  Eyes: Conjunctivae and EOM are normal. Pupils are equal, round, and reactive to light. No scleral icterus.  Neck: Normal range of motion. Neck supple.  Cardiovascular: Normal rate, regular rhythm and intact distal pulses.   Pulses:      Radial pulses are 2+  on the right side, and 2+ on the left side.  Pulmonary/Chest: Effort normal. No respiratory distress.  Musculoskeletal: Normal range of motion.  Lymphadenopathy:    He has no cervical adenopathy.  Neurological: He is alert and oriented to person, place, and time.  Skin: Skin is warm and dry. No rash noted. He is not diaphoretic. No erythema. No pallor.  Pre auricular swelling approximately 1.5 cm in diameter that is tender to touch and mildly fluctuant. No purulent drainage, weeping, erythema, or red linear streaking.   Psychiatric: He has a normal mood and affect. His behavior is normal.    ED Course  Procedures (including critical care time)  DIAGNOSTIC STUDIES: Oxygen  Saturation is 100% on room air, normal by my interpretation.    COORDINATION OF CARE: 9:55 PM -Will perform I&D. Patient verbalizes understanding and agrees with treatment plan.  INCISION AND DRAINAGE:  10:20 PM  Performed by: Antony Madura, PA Consent: Verbal consent obtained. Risks and benefits: risks, benefits and alternatives were discussed Type: abscess  Body area: left ear  Anesthesia: local infiltration  Incision was made with a scalpel.  Local anesthetic: lidocaine 2% without epinephrine  Anesthetic total: 1 ml  Complexity: complex Blunt dissection to break up loculations  Drainage: purulent and bloody  Drainage amount: copious  Packing material: none  Patient tolerance: Patient tolerated the procedure well with no immediate complications.   Labs Review Labs Reviewed - No data to display  Imaging Review No results found.  MDM   1. Abscess of external ear, left    32 year old male presents for abscess to left side of face and preauricular area. Patient as well and nontoxic appearing, hemodynamically stable, and afebrile. Physical exam findings as above. Abscess incised and drained at bedside with copious amounts of purulent and bloody drainage. Patient tolerated procedure well. He is appropriate for discharge with followup in 48 hours for a recheck. Ibuprofen recommended for pain control and topical warm, moist compresses advised at least 4 times a day to promote drainage. Return precautions discussed and patient agreeable to plan with no unaddressed concerns.  I personally performed the services described in this documentation, which was scribed in my presence. The recorded information has been reviewed and is accurate.   Antony Madura, PA-C 11/22/12 1738

## 2012-11-21 NOTE — ED Notes (Signed)
Pt reports abscess to the L side of his face causing facial pain x4 days

## 2012-11-24 NOTE — ED Provider Notes (Signed)
Medical screening examination/treatment/procedure(s) were performed by non-physician practitioner and as supervising physician I was immediately available for consultation/collaboration.    Pama Roskos R Arleigh Odowd, MD 11/24/12 0838 

## 2013-01-01 ENCOUNTER — Emergency Department (HOSPITAL_COMMUNITY)
Admission: EM | Admit: 2013-01-01 | Discharge: 2013-01-01 | Disposition: A | Discharge status: Home / Self Care | Payer: Self-pay | Attending: Emergency Medicine | Admitting: Emergency Medicine

## 2013-01-01 ENCOUNTER — Encounter (HOSPITAL_COMMUNITY): Payer: Self-pay | Admitting: Emergency Medicine

## 2013-01-01 DIAGNOSIS — Y9289 Other specified places as the place of occurrence of the external cause: Secondary | ICD-10-CM | POA: Insufficient documentation

## 2013-01-01 DIAGNOSIS — W298XXA Contact with other powered powered hand tools and household machinery, initial encounter: Secondary | ICD-10-CM | POA: Insufficient documentation

## 2013-01-01 DIAGNOSIS — Z880 Allergy status to penicillin: Secondary | ICD-10-CM | POA: Insufficient documentation

## 2013-01-01 DIAGNOSIS — S01501A Unspecified open wound of lip, initial encounter: Secondary | ICD-10-CM | POA: Insufficient documentation

## 2013-01-01 DIAGNOSIS — Y9389 Activity, other specified: Secondary | ICD-10-CM | POA: Insufficient documentation

## 2013-01-01 DIAGNOSIS — S01511A Laceration without foreign body of lip, initial encounter: Secondary | ICD-10-CM

## 2013-01-01 DIAGNOSIS — F17200 Nicotine dependence, unspecified, uncomplicated: Secondary | ICD-10-CM | POA: Insufficient documentation

## 2013-01-01 DIAGNOSIS — Z792 Long term (current) use of antibiotics: Secondary | ICD-10-CM | POA: Insufficient documentation

## 2013-01-01 MED ORDER — CEPHALEXIN 500 MG PO CAPS: 500.0000 mg | ORAL_CAPSULE | Freq: Four times a day (QID) | ORAL | Status: DC

## 2013-01-01 MED ORDER — HYDROCODONE-ACETAMINOPHEN 5-325 MG PO TABS: 1.0000 | ORAL_TABLET | Freq: Four times a day (QID) | ORAL | Status: DC | PRN

## 2013-01-01 NOTE — ED Provider Notes (Signed)
I saw and evaluated the patient, reviewed the resident's note and I agree with the findings and plan.   .Face to face Exam:  General:  Awake HEENT:  Atraumatic Resp:  Normal effort Abd:  Nondistended Neuro:No focal weakness Lymph: No adenopathy  Nelia Shi, MD 01/01/13 2311

## 2013-01-01 NOTE — ED Provider Notes (Signed)
CSN: [id number]     Arrival date & time 01/01/13  1702 History   First MD Initiated Contact with Patient 01/01/13 1717     Chief Complaint  Patient presents with  . Lip Laceration   (Consider location/radiation/quality/duration/timing/severity/associated sxs/prior Treatment) HPI  32 y/o male here with a lip laceration after being hit in the face with a chainsaw. Was cutting some branches when his chainsaw hit a fence and bounced forcefully and hit him in the face. He states he had ringing in his ears and was knocked down but did not lose consciousness. He denies any other medical problems.   Past Medical History  Diagnosis Date  . Dislocated knee    Past Surgical History  Procedure Laterality Date  . Hernia repair    . Appendectomy     No family history on file. History  Substance Use Topics  . Smoking status: Current Every Day Smoker -- 0.50 packs/day  . Smokeless tobacco: Not on file  . Alcohol Use: No    Review of Systems  Constitutional: Negative for fever, chills and diaphoresis.  HENT: Negative for sore throat.   Eyes: Negative for visual disturbance.  Respiratory: Negative for cough and shortness of breath.   Gastrointestinal: Negative for nausea, vomiting, abdominal pain and diarrhea.  Genitourinary: Negative for dysuria.  Musculoskeletal: Negative for back pain.  Skin: Positive for wound.  Neurological: Negative for headaches.    Allergies  Amoxicillin  Home Medications   Current Outpatient Rx  Name  Route  Sig  Dispense  Refill  . cephALEXin (KEFLEX) 500 MG capsule   Oral   Take 1 capsule (500 mg total) by mouth 4 (four) times daily.   28 capsule   0   . HYDROcodone-acetaminophen (NORCO) 5-325 MG per tablet   Oral   Take 1 tablet by mouth every 6 (six) hours as needed for moderate pain.   30 tablet   0    BP 140/91  Pulse 79  Resp 16  SpO2 100% Physical Exam  Constitutional: He is oriented to person, place, and time. He appears  well-developed and well-nourished. No distress.  HENT:  Head: Normocephalic.  R lip with 8-10 mm wide defect approx 5-7 mm deep and 1 cm long extending down over the Bee border.   Eyes: EOM are normal. Pupils are equal, round, and reactive to light.  Neck: Normal range of motion. Neck supple.  Cardiovascular: Normal rate, regular rhythm and normal heart sounds.   Pulmonary/Chest: Effort normal and breath sounds normal. No respiratory distress. He has no wheezes.  Abdominal: Soft. Bowel sounds are normal. There is no tenderness.  Musculoskeletal: He exhibits no edema.  Neurological: He is alert and oriented to person, place, and time.  Skin: Skin is warm and dry. He is not diaphoretic.  Psychiatric: He has a normal mood and affect.    ED Course  LACERATION REPAIR Date/Time: 01/01/2013 7:31 PM Performed by: Elenora Gamma Authorized by: Nelia Shi Consent: Verbal consent obtained. Body area: head/neck Anesthesia: local infiltration Local anesthetic: lidocaine 2% with epinephrine  Area infiltrated for pain control. OMFS consulted for repair.    (including critical care time) Labs Review Labs Reviewed - No data to display Imaging Review No results found.  EKG Interpretation   None       MDM   1. Lip laceration, initial encounter     32 y/o male here with R lip and chin wound after a chainsaw accident. Pain improved with local  xylocaine injection, bleeding controlled. Given location and complexity of wound will consult OMFS for repair and recommendations.   OMFS performed closure, recommend pain meds, keflex, and follow up in 5 days.   Murtis Sink, MD [facility] Family Medicine Resident, PGY-2 01/01/2013, 7:32 PM       Elenora Gamma, MD 01/01/13 [phone]

## 2013-01-01 NOTE — ED Notes (Signed)
Per EMS: pt cut lip with chainsaw, medium sized laceration to right side of lip. Bleeding at the moment, 7/10 pain.

## 2013-01-01 NOTE — ED Notes (Signed)
Bed: AV40 Expected date:  Expected time:  Means of arrival:  Comments: ems- ambulatory, cut lower lip with a chainsaw

## 2013-01-01 NOTE — Consult Note (Signed)
Oral and Maxillofacial Surgery - Consultation  Reason for Consult:Right lower lip laceration Referring Physician: Dr. Solmon Anderson is an 32 y.o. male.  HPI: Alvin Anderson was at work [employer]) and was using a chainsaw to cut wood.  The chainsaw "kicked back" towards his face, but threw the chainsaw before it cut more deeply.  He was brought to [facility].  PMHx:  Past Medical History  Diagnosis Date  . Dislocated knee     PSx:  Past Surgical History  Procedure Laterality Date  . Hernia repair    . Appendectomy      Family Hx: No family history on file.  Social Hx:  reports that he has been smoking.  He does not have any smokeless tobacco history on file. He reports that he does not drink alcohol or use illicit drugs.  Allergies:  Allergies  Allergen Reactions  . Amoxicillin Hives    Medications: I have reviewed the patient's current medications.  Labs: No results found for this or any previous visit (from the past 48 hour(s)).  Radiology: No results found.  [id number] items are noted in HPI.  Vital Signs: BP 140/91  Pulse 79  Resp 16  SpO2 100%  Physical Exam: General appearance: alert and cooperative Head: Normocephalic, without obvious abnormality Eyes: conjunctivae/corneas clear. PERRL, EOM's intact. Fundi benign. Ears: normal TM's and external ear canals both ears Nose: Nares normal. Septum midline. Mucosa normal. No drainage or sinus tenderness. Throat: abnormal findings: dentition: multiple carries and root tips and lower right lip laceration measuring 5 cm in length down into muscle.  The laceration is stellate.   Occlusion is stable and repeatable.  Mandible and Maxilla is stable.  The right upper lip has an abrasion.    Assessment/Plan: Alvin Anderson sustained a right lower lip laceration while using a chainsaw at work. 1. Irrigated the wound with Normal Saline 2. Prepared the area with Betadine  3. Placed 10 mL of 2% Lidocaine  with 1:100,000 epinephrine into the right mental region.   4. Used scissors to remove irregular tissue at the margin of the laceration.   5.  Placed 4-0 Vicryl deep into muscle and subcutaneous tissue  6. Placed 5-0 Prolene in the skin.   7. Place Bacitracin onto the wound.   8. The patient is to follow up in my office in 5 to 7 days for suture removal [phone].  Recommend Antibiotic coverage, topical triple antibiotic ointment, pain medication as needed per ER.  [place],Alvin Anderson  01/01/2013, 7:28 PM

## 2013-04-29 ENCOUNTER — Encounter (HOSPITAL_COMMUNITY): Payer: Self-pay | Admitting: Emergency Medicine

## 2013-04-29 ENCOUNTER — Emergency Department (HOSPITAL_COMMUNITY)
Admission: EM | Admit: 2013-04-29 | Discharge: 2013-04-29 | Disposition: A | Discharge status: Home / Self Care | Payer: Self-pay | Attending: Emergency Medicine | Admitting: Emergency Medicine

## 2013-04-29 DIAGNOSIS — L0291 Cutaneous abscess, unspecified: Secondary | ICD-10-CM

## 2013-04-29 DIAGNOSIS — F17200 Nicotine dependence, unspecified, uncomplicated: Secondary | ICD-10-CM | POA: Insufficient documentation

## 2013-04-29 DIAGNOSIS — Z87828 Personal history of other (healed) physical injury and trauma: Secondary | ICD-10-CM | POA: Insufficient documentation

## 2013-04-29 DIAGNOSIS — H60399 Other infective otitis externa, unspecified ear: Secondary | ICD-10-CM | POA: Insufficient documentation

## 2013-04-29 MED ORDER — CEPHALEXIN 500 MG PO CAPS: ORAL_CAPSULE | ORAL | Status: DC

## 2013-04-29 MED ORDER — SULFAMETHOXAZOLE-TRIMETHOPRIM 800-160 MG PO TABS: 1.0000 | ORAL_TABLET | Freq: Two times a day (BID) | ORAL | Status: DC

## 2013-04-29 NOTE — ED Notes (Signed)
Pt alert, arrives from home, c/o lump that has formed by ear, onset was last October, states had I/D performed, lump has returned and became painful

## 2013-04-29 NOTE — ED Provider Notes (Signed)
CSN: [id number]     Arrival date & time 04/29/13  0903 History   First MD Initiated Contact with Patient 04/29/13 1007     Chief Complaint  Patient presents with  . Wound Check    Left Ear, onset last Oct     (Consider location/radiation/quality/duration/timing/severity/associated sxs/prior Treatment) HPI Comments: Patient is a 33 year old male who presents today with an abscess in the preauricular area on his left ear. He reports that this began in October when a rock hit his ear. At that time he was seen in the emergency department and had the abscess drained. He had initial improvement, but there has been consistent purulent drainage coming from the area since that time. Over the past 5 days the area has become increasing painful. He describes the pain as a tight sensation. The pain radiates up and down his face. He denies fevers, chills, nausea, vomiting, abdominal pain, shortness of breath, chest pain, myalgias.   Patient is a 33 y.o. male presenting with wound check. The history is provided by the patient. No language interpreter was used.  Wound Check Pertinent negatives include no abdominal pain, chest pain, chills, fever, myalgias, nausea or vomiting.    Past Medical History  Diagnosis Date  . Dislocated knee    Past Surgical History  Procedure Laterality Date  . Hernia repair    . Appendectomy     History reviewed. No pertinent family history. History  Substance Use Topics  . Smoking status: Current Every Day Smoker -- 0.50 packs/day  . Smokeless tobacco: Not on file  . Alcohol Use: No    Review of Systems  Constitutional: Negative for fever and chills.  Respiratory: Negative for shortness of breath.   Cardiovascular: Negative for chest pain.  Gastrointestinal: Negative for nausea, vomiting and abdominal pain.  Musculoskeletal: Negative for myalgias.  Skin: Positive for wound.  All other systems reviewed and are negative.      Allergies  Amoxicillin  Home  Medications   Current Outpatient Rx  Name  Route  Sig  Dispense  Refill  . cephALEXin (KEFLEX) 500 MG capsule   Oral   Take 1 capsule (500 mg total) by mouth 4 (four) times daily.   28 capsule   0   . HYDROcodone-acetaminophen (NORCO) 5-325 MG per tablet   Oral   Take 1 tablet by mouth every 6 (six) hours as needed for moderate pain.   30 tablet   0    BP 142/86  Pulse 84  Temp(Src) 98 F (36.7 C) (Oral)  Resp 16  Wt 187 lb (84.823 kg)  SpO2 99% Physical Exam  Nursing note and vitals reviewed. Constitutional: He is oriented to person, place, and time. He appears well-developed and well-nourished.  Non-toxic appearance. He does not have a sickly appearance. He does not appear ill. No distress.  HENT:  Head: Normocephalic and atraumatic.  Right Ear: External ear normal.  Left Ear: External ear normal.  Nose: Nose normal.  1.5 cm area of fluctuance in pre-auricular area. No surrounding erythema, streaking. No induration.  Eyes: Conjunctivae are normal.  Neck: Normal range of motion. No tracheal deviation present.  Cardiovascular: Normal rate, regular rhythm and normal heart sounds.   Pulmonary/Chest: Effort normal and breath sounds normal. No stridor.  Abdominal: Soft. He exhibits no distension. There is no tenderness.  Musculoskeletal: Normal range of motion.  Neurological: He is alert and oriented to person, place, and time.  Skin: Skin is warm and dry. He is not diaphoretic.  Psychiatric: He has a normal mood and affect. His behavior is normal.    ED Course  Procedures (including critical care time) Labs Review Labs Reviewed - No data to display Imaging Review No results found.   EKG Interpretation None      INCISION AND DRAINAGE Performed by: Junious SilkMerrell, Ayman Brull Consent: Verbal consent obtained. Risks and benefits: risks, benefits and alternatives were discussed Type: abscess  Body area: left preauricular area  Anesthesia: local infiltration  Incision  was made with a scalpel.  Local anesthetic: lidocaine 2 %  Anesthetic total: 2 ml  Complexity: complex Blunt dissection to break up loculations  Drainage: purulent  Drainage amount: copious  Patient tolerance: Patient tolerated the procedure well with no immediate complications.     MDM   Final diagnoses:  Abscess    Patient with skin abscess amenable to incision and drainage.  Abscess was not large enough to warrant packing or drain,  wound recheck in 2 days. Encouraged home warm soaks and flushing.  No signs of cellulitis is surrounding skin.  Will d/c to home.  Due to recurrent nature of abscess abx were given and ENT referral for definitive therapy. Return instructions given. Vital signs stable for discharge. Patient / Family / Caregiver informed of clinical course, understand medical decision-making process, and agree with plan.      Mora BellmanHannah S Yichen Gilardi, PA-C 04/29/13 1058

## 2013-04-29 NOTE — ED Provider Notes (Signed)
Medical screening examination/treatment/procedure(s) were performed by non-physician practitioner and as supervising physician I was immediately available for consultation/collaboration.  Neriah Brott, MD 04/29/13 1600 

## 2013-04-29 NOTE — Discharge Instructions (Signed)
Abscess °An abscess (boil or furuncle) is an infected area on or under the skin. This area is filled with yellowish-white fluid (pus) and other material (debris). °HOME CARE  °· Only take medicines as told by your doctor. °· If you were given antibiotic medicine, take it as directed. Finish the medicine even if you start to feel better. °· If gauze is used, follow your doctor's directions for changing the gauze. °· To avoid spreading the infection: °· Keep your abscess covered with a bandage. °· Wash your hands well. °· Do not share personal care items, towels, or whirlpools with others. °· Avoid skin contact with others. °· Keep your skin and clothes clean around the abscess. °· Keep all doctor visits as told. °GET HELP RIGHT AWAY IF:  °· You have more pain, puffiness (swelling), or redness in the wound site. °· You have more fluid or blood coming from the wound site. °· You have muscle aches, chills, or you feel sick. °· You have a fever. °MAKE SURE YOU:  °· Understand these instructions. °· Will watch your condition. °· Will get help right away if you are not doing well or get worse. °Document Released: 07/27/2007 Document Revised: 08/09/2011 Document Reviewed: 04/22/2011 °ExitCare® Patient Information ©2014 [employer]. ° °Abscess °Care After °An abscess (also called a boil or furuncle) is an infected area that contains a collection of pus. Signs and symptoms of an abscess include pain, tenderness, redness, or hardness, or you may feel a moveable soft area under your skin. An abscess can occur anywhere in the body. The infection may spread to surrounding tissues causing cellulitis. A cut (incision) by the surgeon was made over your abscess and the pus was drained out. Gauze may have been packed into the space to provide a drain that will allow the cavity to heal from the inside outwards. The boil may be painful for 5 to 7 days. Most people with a boil do not have high fevers. Your abscess, if seen early, may  not have localized, and may not have been lanced. If not, another appointment may be required for this if it does not get better on its own or with medications. °HOME CARE INSTRUCTIONS  °· Only take over-the-counter or prescription medicines for pain, discomfort, or fever as directed by your caregiver. °· When you bathe, soak and then remove gauze or iodoform packs at least daily or as directed by your caregiver. You may then wash the wound gently with mild soapy water. Repack with gauze or do as your caregiver directs. °SEEK IMMEDIATE MEDICAL CARE IF:  °· You develop increased pain, swelling, redness, drainage, or bleeding in the wound site. °· You develop signs of generalized infection including muscle aches, chills, fever, or a general ill feeling. °· An oral temperature above 102° F (38.9° C) develops, not controlled by medication. °See your caregiver for a recheck if you develop any of the symptoms described above. If medications (antibiotics) were prescribed, take them as directed. °Document Released: 08/26/2004 Document Revised: 05/02/2011 Document Reviewed: 04/23/2007 °ExitCare® Patient Information ©2014 [employer]. ° °

## 2013-04-29 NOTE — Progress Notes (Signed)
[facility] provided pt with a list of primary care resources and a [facility] Orange Card application to help patient establish primary care.  °

## 2014-03-23 ENCOUNTER — Encounter (HOSPITAL_COMMUNITY): Payer: Self-pay | Admitting: Emergency Medicine

## 2014-03-23 ENCOUNTER — Emergency Department (HOSPITAL_COMMUNITY)
Admission: EM | Admit: 2014-03-23 | Discharge: 2014-03-23 | Disposition: A | Discharge status: Home / Self Care | Payer: Self-pay | Attending: Emergency Medicine | Admitting: Emergency Medicine

## 2014-03-23 DIAGNOSIS — X58XXXA Exposure to other specified factors, initial encounter: Secondary | ICD-10-CM | POA: Insufficient documentation

## 2014-03-23 DIAGNOSIS — Y990 Civilian activity done for income or pay: Secondary | ICD-10-CM | POA: Insufficient documentation

## 2014-03-23 DIAGNOSIS — Z720 Tobacco use: Secondary | ICD-10-CM | POA: Insufficient documentation

## 2014-03-23 DIAGNOSIS — Z880 Allergy status to penicillin: Secondary | ICD-10-CM | POA: Insufficient documentation

## 2014-03-23 DIAGNOSIS — Y9389 Activity, other specified: Secondary | ICD-10-CM | POA: Insufficient documentation

## 2014-03-23 DIAGNOSIS — S339XXA Sprain of unspecified parts of lumbar spine and pelvis, initial encounter: Secondary | ICD-10-CM | POA: Insufficient documentation

## 2014-03-23 DIAGNOSIS — S335XXA Sprain of ligaments of lumbar spine, initial encounter: Secondary | ICD-10-CM

## 2014-03-23 DIAGNOSIS — Y9289 Other specified places as the place of occurrence of the external cause: Secondary | ICD-10-CM | POA: Insufficient documentation

## 2014-03-23 LAB — URINALYSIS, ROUTINE W REFLEX MICROSCOPIC
Bilirubin Urine: NEGATIVE
GLUCOSE, UA: NEGATIVE mg/dL
Hgb urine dipstick: NEGATIVE
Ketones, ur: NEGATIVE mg/dL
Leukocytes, UA: NEGATIVE
Nitrite: NEGATIVE
PH: 6 (ref 5.0–8.0)
PROTEIN: NEGATIVE mg/dL
Specific Gravity, Urine: 1.034 — ABNORMAL HIGH (ref 1.005–1.030)
Urobilinogen, UA: 1 mg/dL (ref 0.0–1.0)

## 2014-03-23 MED ORDER — HYDROMORPHONE HCL 2 MG/ML IJ SOLN
2.0000 mg | Freq: Once | INTRAMUSCULAR | Status: AC
  Administered 2014-03-23: 2 mg via INTRAMUSCULAR
  Filled 2014-03-23: qty 1

## 2014-03-23 MED ORDER — HYDROCODONE-ACETAMINOPHEN 5-325 MG PO TABS
2.0000 | ORAL_TABLET | ORAL | Status: DC | PRN
Start: 2014-03-23 — End: 2015-05-17

## 2014-03-23 MED ORDER — KETOROLAC TROMETHAMINE 60 MG/2ML IM SOLN
60.0000 mg | Freq: Once | INTRAMUSCULAR | Status: AC
  Administered 2014-03-23: 60 mg via INTRAMUSCULAR
  Filled 2014-03-23: qty 2

## 2014-03-23 MED ORDER — PREDNISONE 10 MG PO TABS: ORAL_TABLET | ORAL | Status: DC

## 2014-03-23 NOTE — ED Provider Notes (Signed)
CSN: [id number]     Arrival date & time 03/23/14  1325 History   This chart was scribed for non-physician practitioner, Elson Areas, PA-C, working with Doug Sou, MD, by Lionel December, ED Scribe. This patient was seen in room WTR6/WTR6 and the patient's care was started at 3:02 PM. First MD Initiated Contact with Patient 03/23/14 1451     Chief Complaint  Patient presents with  . Back Pain     (Consider location/radiation/quality/duration/timing/severity/associated sxs/prior Treatment) Patient is a 34 y.o. male presenting with back pain. The history is provided by the patient. No language interpreter was used.  Back Pain Associated symptoms: no dysuria and no fever     HPI Comments: Alvin Anderson is a 34 y.o. male who presents to the Emergency Department complaining of gradually increasing sharp stabbing back pain which has worsened in the past 24 hours.  Patient notes that he has had back pain for the last 6 months but it is different today.  He denies any recent injury but does landscaping which involves heavy duty work.  Patient notes he took Hydrocodone and a muscle relaxer (which he got from a friend) today and laid on his stomach this morning to alleviate his pain but states that it did not work.Patient has associated symptoms of dark urine.  Patient denies dysuria, or any recent falls.  Patient does not have a PCP.    Past Medical History  Diagnosis Date  . Dislocated knee    Past Surgical History  Procedure Laterality Date  . Hernia repair    . Appendectomy     No family history on file. History  Substance Use Topics  . Smoking status: Current Every Day Smoker -- 0.50 packs/day  . Smokeless tobacco: Not on file  . Alcohol Use: No    Review of Systems  Constitutional: Negative for fever, chills and fatigue.  Genitourinary: Negative for dysuria.  Musculoskeletal: Positive for back pain.  All other systems reviewed and are negative.     Allergies   Amoxicillin  Home Medications   Prior to Admission medications   Medication Sig Start Date End Date Taking? Authorizing Provider  HYDROcodone-acetaminophen (NORCO) 5-325 MG per tablet Take 1 tablet by mouth every 6 (six) hours as needed for moderate pain. Patient taking differently: Take 1 tablet by mouth every 6 (six) hours as needed for moderate pain. For pain 01/01/13  Yes Elenora Gamma, MD  cephALEXin (KEFLEX) 500 MG capsule Take 1 capsule (500 mg total) by mouth 4 (four) times daily. Patient not taking: Reported on 03/23/2014 01/01/13   Elenora Gamma, MD  cephALEXin [facility]) 500 MG capsule 2 caps po bid x 7 days Patient not taking: Reported on 03/23/2014 04/29/13   Mora Bellman, PA-C  sulfamethoxazole-trimethoprim (SEPTRA DS) 800-160 MG per tablet Take 1 tablet by mouth every 12 (twelve) hours. Patient not taking: Reported on 03/23/2014 04/29/13   Mora Bellman, PA-C   BP 129/80 mmHg  Pulse 73  Temp(Src) 98 F (36.7 C) (Oral)  Resp 18  SpO2 100% Physical Exam  Constitutional: He is oriented to person, place, and time. He appears well-developed and well-nourished. No distress.  HENT:  Head: Normocephalic and atraumatic.  Eyes: Conjunctivae and EOM are normal.  Neck: Neck supple. No tracheal deviation present.  Cardiovascular: Normal rate.   Pulmonary/Chest: Effort normal. No respiratory distress.  Musculoskeletal:  Tender left flank and tender lumbar spine diffusely.   Neurological: He is alert and oriented to person, place,  and time.  Skin: Skin is warm and dry.  Psychiatric: He has a normal mood and affect. His behavior is normal.  Nursing note and vitals reviewed.   ED Course  Procedures (including critical care time) DIAGNOSTIC STUDIES: Oxygen Saturation is 100% on RA, normal by my interpretation.    COORDINATION OF CARE: 3:07 PM Discussed treatment plan with patient at beside, the patient agrees with the plan and has no further questions at this  time.  Labs Review Labs Reviewed - No data to display  Imaging Review No results found.   EKG Interpretation None      MDM   Final diagnoses:  Lumbar back sprain, initial encounter    Meds ordered this encounter  Medications  . ketorolac (TORADOL) injection 60 mg    Sig:   . HYDROmorphone (DILAUDID) injection 2 mg    Sig:   . HYDROcodone-acetaminophen (NORCO/VICODIN) 5-325 MG per tablet    Sig: Take 2 tablets by mouth every 4 (four) hours as needed.    Dispense:  20 tablet    Refill:  0    Order Specific Question:  Supervising Provider    Answer:  Eber HongMILLER, BRIAN D [[id number]]  . predniSONE (DELTASONE) 10 MG tablet    Sig: 6,5,4,3,2,1 taper    Dispense:  21 tablet    Refill:  0    Order Specific Question:  Supervising Provider    Answer:  Eber HongMILLER, BRIAN D [[id number]]    I personally performed the services in this documentation, which was scribed in my presence.  The recorded information has been reviewed and considered.   Barnet PallKaren SofiaPAC.  Lonia SkinnerLeslie K FayetteSofia, PA-C 03/23/14 1958  Doug SouSam Jacubowitz, MD 03/24/14 1016

## 2014-03-23 NOTE — ED Notes (Signed)
Per pt, states increased lower back pain since early this am-history of chronic back pain-states he took 2 pain killers this am

## 2014-03-23 NOTE — Discharge Instructions (Signed)
Back Pain, Adult Low back pain is very common. About 1 in 5 people have back pain.The cause of low back pain is rarely dangerous. The pain often gets better over time.About half of people with a sudden onset of back pain feel better in just 2 weeks. About 8 in 10 people feel better by 6 weeks.  CAUSES Some common causes of back pain include:  Strain of the muscles or ligaments supporting the spine.  Wear and tear (degeneration) of the spinal discs.  Arthritis.  Direct injury to the back. DIAGNOSIS Most of the time, the direct cause of low back pain is not known.However, back pain can be treated effectively even when the exact cause of the pain is unknown.Answering your caregiver's questions about your overall health and symptoms is one of the most accurate ways to make sure the cause of your pain is not dangerous. If your caregiver needs more information, he or she may order lab work or imaging tests (X-rays or MRIs).However, even if imaging tests show changes in your back, this usually does not require surgery. HOME CARE INSTRUCTIONS For many people, back pain returns.Since low back pain is rarely dangerous, it is often a condition that people can learn to manageon their own.   Remain active. It is stressful on the back to sit or stand in one place. Do not sit, drive, or stand in one place for more than 30 minutes at a time. Take short walks on level surfaces as soon as pain allows.Try to increase the length of time you walk each day.  Do not stay in bed.Resting more than 1 or 2 days can delay your recovery.  Do not avoid exercise or work.Your body is made to move.It is not dangerous to be active, even though your back may hurt.Your back will likely heal faster if you return to being active before your pain is gone.  Pay attention to your body when you bend and lift. Many people have less discomfortwhen lifting if they bend their knees, keep the load close to their bodies,and  avoid twisting. Often, the most comfortable positions are those that put less stress on your recovering back.  Find a comfortable position to sleep. Use a firm mattress and lie on your side with your knees slightly bent. If you lie on your back, put a pillow under your knees.  Only take over-the-counter or prescription medicines as directed by your caregiver. Over-the-counter medicines to reduce pain and inflammation are often the most helpful.Your caregiver may prescribe muscle relaxant drugs.These medicines help dull your pain so you can more quickly return to your normal activities and healthy exercise.  Put ice on the injured area.  Put ice in a plastic bag.  Place a towel between your skin and the bag.  Leave the ice on for 15-20 minutes, 03-04 times a day for the first 2 to 3 days. After that, ice and heat may be alternated to reduce pain and spasms.  Ask your caregiver about trying back exercises and gentle massage. This may be of some benefit.  Avoid feeling anxious or stressed.Stress increases muscle tension and can worsen back pain.It is important to recognize when you are anxious or stressed and learn ways to manage it.Exercise is a great option. SEEK MEDICAL CARE IF:  You have pain that is not relieved with rest or medicine.  You have pain that does not improve in 1 week.  You have new symptoms.  You are generally not feeling well. SEEK   IMMEDIATE MEDICAL CARE IF:   You have pain that radiates from your back into your legs.  You develop new bowel or bladder control problems.  You have unusual weakness or numbness in your arms or legs.  You develop nausea or vomiting.  You develop abdominal pain.  You feel faint. Document Released: 02/07/2005 Document Revised: 08/09/2011 Document Reviewed: 06/11/2013 [employer] Patient Information 2015 [employer]. This information is not intended to replace advice given to you by your health care provider. Make sure you  discuss any questions you have with your health care provider.  

## 2014-06-16 ENCOUNTER — Emergency Department (HOSPITAL_COMMUNITY)
Admission: EM | Admit: 2014-06-16 | Discharge: 2014-06-16 | Disposition: A | Discharge status: Home / Self Care | Payer: Self-pay | Attending: Emergency Medicine | Admitting: Emergency Medicine

## 2014-06-16 ENCOUNTER — Encounter (HOSPITAL_COMMUNITY): Payer: Self-pay | Admitting: Emergency Medicine

## 2014-06-16 DIAGNOSIS — K0510 Chronic gingivitis, plaque induced: Secondary | ICD-10-CM | POA: Insufficient documentation

## 2014-06-16 DIAGNOSIS — K0889 Other specified disorders of teeth and supporting structures: Secondary | ICD-10-CM

## 2014-06-16 DIAGNOSIS — Z87828 Personal history of other (healed) physical injury and trauma: Secondary | ICD-10-CM | POA: Insufficient documentation

## 2014-06-16 DIAGNOSIS — Z720 Tobacco use: Secondary | ICD-10-CM | POA: Insufficient documentation

## 2014-06-16 DIAGNOSIS — Z880 Allergy status to penicillin: Secondary | ICD-10-CM | POA: Insufficient documentation

## 2014-06-16 MED ORDER — TRAMADOL HCL 50 MG PO TABS: 50.0000 mg | ORAL_TABLET | Freq: Four times a day (QID) | ORAL | Status: DC | PRN

## 2014-06-16 MED ORDER — NAPROXEN 500 MG PO TABS
500.0000 mg | ORAL_TABLET | Freq: Once | ORAL | Status: AC
  Administered 2014-06-16: 500 mg via ORAL
  Filled 2014-06-16: qty 1

## 2014-06-16 MED ORDER — NAPROXEN 500 MG PO TABS: 500.0000 mg | ORAL_TABLET | Freq: Two times a day (BID) | ORAL | Status: DC

## 2014-06-16 MED ORDER — CLINDAMYCIN HCL 150 MG PO CAPS: 150.0000 mg | ORAL_CAPSULE | Freq: Four times a day (QID) | ORAL | Status: DC

## 2014-06-16 NOTE — ED Provider Notes (Signed)
CSN: [id number]     Arrival date & time 06/16/14  0818 History   First MD Initiated Contact with Patient 06/16/14 0827     Chief Complaint  Patient presents with  . Abscess    Patient is a 34 y.o. male presenting with tooth pain. The history is provided by the patient.  Dental Pain Location:  Upper Upper teeth location:  14/LU 1st molar Quality:  Dull Severity:  Moderate Onset quality:  Gradual Duration:  5 days Timing:  Constant Progression:  Worsening Chronicity:  New Context: dental caries   Context: not enamel fracture, filling still in place, not recent dental surgery and not trauma   Relieved by:  Nothing Ineffective treatments: salt water gargle. Associated symptoms: facial pain and gum swelling   Associated symptoms: no congestion, no drooling, no facial swelling, no fever, no oral bleeding and no trismus     Past Medical History  Diagnosis Date  . Dislocated knee    Past Surgical History  Procedure Laterality Date  . Hernia repair    . Appendectomy     No family history on file. History  Substance Use Topics  . Smoking status: Current Every Day Smoker -- 0.50 packs/day  . Smokeless tobacco: Not on file  . Alcohol Use: No    Review of Systems  Constitutional: Negative for fever.  HENT: Negative for congestion, drooling and facial swelling.   Respiratory: Negative for shortness of breath.   Cardiovascular: Negative for chest pain.  All other systems reviewed and are negative.     Allergies  Amoxicillin  Home Medications   Prior to Admission medications   Medication Sig Start Date End Date Taking? Authorizing Provider  clindamycin (CLEOCIN) 150 MG capsule Take 1 capsule (150 mg total) by mouth every 6 (six) hours. 06/16/14   Linwood Dibbles, MD  HYDROcodone-acetaminophen (NORCO/VICODIN) 5-325 MG per tablet Take 2 tablets by mouth every 4 (four) hours as needed. 03/23/14   Elson Areas, PA-C  naproxen (NAPROSYN) 500 MG tablet Take 1 tablet (500 mg total)  by mouth 2 (two) times daily. 06/16/14   Linwood Dibbles, MD  predniSONE (DELTASONE) 10 MG tablet 6,5,4,3,2,1 taper 03/23/14   Elson Areas, PA-C  traMADol (ULTRAM) 50 MG tablet Take 1 tablet (50 mg total) by mouth every 6 (six) hours as needed. 06/16/14   Linwood Dibbles, MD   BP 127/80 mmHg  Pulse 89  Temp(Src) 97.6 F (36.4 C) (Oral)  Resp 20  SpO2 99% Physical Exam  Constitutional: He appears well-developed and well-nourished. No distress.  HENT:  Head: Normocephalic and atraumatic.  Right Ear: External ear normal.  Left Ear: External ear normal.  Nose: Nose normal.  Mouth/Throat: No trismus in the jaw. No uvula swelling or lacerations. No oropharyngeal exudate, posterior oropharyngeal edema or posterior oropharyngeal erythema.    Eyes: Conjunctivae are normal. Right eye exhibits no discharge. Left eye exhibits no discharge. No scleral icterus.  Neck: Normal range of motion. Neck supple. No tracheal deviation present.  Cardiovascular: Normal rate.   Pulmonary/Chest: Effort normal. No stridor. No respiratory distress.  Musculoskeletal: He exhibits no edema.  Lymphadenopathy:    He has no cervical adenopathy.  Neurological: He is alert. Cranial nerve deficit: no gross deficits.  Skin: Skin is warm and dry. No rash noted.  Psychiatric: He has a normal mood and affect.  Nursing note and vitals reviewed.   ED Course  Procedures (including critical care time) Labs Review Labs Reviewed - No data to display  Imaging Review No results found.   EKG Interpretation None      MDM   Final diagnoses:  Pain, dental  Gingivitis    No obvious abscess.  Posterior pharynx normal.  No facial abscess or trismus.  Possible dental caries.  Appears to have localized gingivitis as well.  Will rx abx.  Pain meds.  Salt water gargles.  Outpatient follow up.    Linwood DibblesJon Teniya Filter, MD 06/16/14 [phone]

## 2014-06-16 NOTE — ED Notes (Signed)
Pt states his left side upper teeth is inflamed and red.  Denies coughing up any blood. The pain has increased over time since 4-20.  Pt hasn't called a dentist.  Denies any N/V. He is alert & orientated x 3.

## 2014-06-16 NOTE — Discharge Instructions (Signed)

## 2014-06-16 NOTE — ED Notes (Signed)
Pt is alert and orientated to discharge instructions.  He was ambulatory to discharge.

## 2015-05-17 ENCOUNTER — Emergency Department (HOSPITAL_COMMUNITY): Payer: Self-pay

## 2015-05-17 ENCOUNTER — Encounter (HOSPITAL_COMMUNITY): Payer: Self-pay | Admitting: Oncology

## 2015-05-17 ENCOUNTER — Emergency Department (HOSPITAL_COMMUNITY)
Admission: EM | Admit: 2015-05-17 | Discharge: 2015-05-17 | Disposition: A | Discharge status: Home / Self Care | Payer: Self-pay | Attending: Emergency Medicine | Admitting: Emergency Medicine

## 2015-05-17 DIAGNOSIS — Z23 Encounter for immunization: Secondary | ICD-10-CM | POA: Insufficient documentation

## 2015-05-17 DIAGNOSIS — F17200 Nicotine dependence, unspecified, uncomplicated: Secondary | ICD-10-CM | POA: Insufficient documentation

## 2015-05-17 DIAGNOSIS — S022XXB Fracture of nasal bones, initial encounter for open fracture: Secondary | ICD-10-CM | POA: Insufficient documentation

## 2015-05-17 DIAGNOSIS — Z791 Long term (current) use of non-steroidal anti-inflammatories (NSAID): Secondary | ICD-10-CM | POA: Insufficient documentation

## 2015-05-17 DIAGNOSIS — S0181XA Laceration without foreign body of other part of head, initial encounter: Secondary | ICD-10-CM

## 2015-05-17 DIAGNOSIS — W2201XA Walked into wall, initial encounter: Secondary | ICD-10-CM | POA: Insufficient documentation

## 2015-05-17 DIAGNOSIS — S0121XA Laceration without foreign body of nose, initial encounter: Secondary | ICD-10-CM | POA: Insufficient documentation

## 2015-05-17 DIAGNOSIS — Y998 Other external cause status: Secondary | ICD-10-CM | POA: Insufficient documentation

## 2015-05-17 DIAGNOSIS — Y9289 Other specified places as the place of occurrence of the external cause: Secondary | ICD-10-CM | POA: Insufficient documentation

## 2015-05-17 DIAGNOSIS — Y9301 Activity, walking, marching and hiking: Secondary | ICD-10-CM | POA: Insufficient documentation

## 2015-05-17 DIAGNOSIS — Z880 Allergy status to penicillin: Secondary | ICD-10-CM | POA: Insufficient documentation

## 2015-05-17 DIAGNOSIS — Z792 Long term (current) use of antibiotics: Secondary | ICD-10-CM | POA: Insufficient documentation

## 2015-05-17 MED ORDER — OXYMETAZOLINE HCL 0.05 % NA SOLN
1.0000 | Freq: Once | NASAL | Status: AC
  Administered 2015-05-17: 1 via NASAL
  Filled 2015-05-17: qty 15

## 2015-05-17 MED ORDER — TETANUS-DIPHTH-ACELL PERTUSSIS 5-2.5-18.5 LF-MCG/0.5 IM SUSP
0.5000 mL | Freq: Once | INTRAMUSCULAR | Status: AC
Start: 2015-05-17 — End: 2015-05-17
  Administered 2015-05-17: 0.5 mL via INTRAMUSCULAR
  Filled 2015-05-17: qty 0.5

## 2015-05-17 MED ORDER — HYDROCODONE-ACETAMINOPHEN 5-325 MG PO TABS: 1.0000 | ORAL_TABLET | ORAL | Status: DC | PRN

## 2015-05-17 MED ORDER — CEPHALEXIN 500 MG PO CAPS: 500.0000 mg | ORAL_CAPSULE | Freq: Four times a day (QID) | ORAL | Status: DC

## 2015-05-17 MED ORDER — LIDOCAINE HCL (PF) 1 % IJ SOLN
5.0000 mL | Freq: Once | INTRAMUSCULAR | Status: AC
  Administered 2015-05-17: 5 mL
  Filled 2015-05-17: qty 5

## 2015-05-17 NOTE — ED Provider Notes (Signed)
CSN: [id number]     Arrival date & time 05/17/15  0553 History   First MD Initiated Contact with Patient 05/17/15 [id number]     Chief Complaint  Patient presents with  . Facial Laceration    HPI   Alvin Anderson is a 35 y.o. male with no pertinent PMH who presents to the ED with laceration to the bridge of his nose. He states he was sleepwalking prior to arrival, and ran face first into a wall. He reports laceration and epistaxis since that time. He also notes diffuse facial pain. He denies exacerbating factors. He has not tried anything for symptom relief. He denies headache, lightheadedness, dizziness, vision changes.   Past Medical History  Diagnosis Date  . Dislocated knee    Past Surgical History  Procedure Laterality Date  . Hernia repair    . Appendectomy     History reviewed. No pertinent family history. Social History  Substance Use Topics  . Smoking status: Current Every Day Smoker -- 0.50 packs/day  . Smokeless tobacco: None  . Alcohol Use: No      Review of Systems  HENT: Positive for nosebleeds.   Skin: Positive for wound.  Neurological: Negative for dizziness, weakness, light-headedness, numbness and headaches.      Allergies  Amoxicillin  Home Medications   Prior to Admission medications   Medication Sig Start Date End Date Taking? Authorizing Provider  cephALEXin (KEFLEX) 500 MG capsule Take 1 capsule (500 mg total) by mouth 4 (four) times daily. 05/17/15   Mady Gemma, PA-C  clindamycin (CLEOCIN) 150 MG capsule Take 1 capsule (150 mg total) by mouth every 6 (six) hours. 06/16/14   Linwood Dibbles, MD  HYDROcodone-acetaminophen (NORCO/VICODIN) 5-325 MG tablet Take 1 tablet by mouth every 4 (four) hours as needed. 05/17/15   Mady Gemma, PA-C  ibuprofen (ADVIL,MOTRIN) 200 MG tablet Take 1,000 mg by mouth every 6 (six) hours as needed for headache or moderate pain.    Historical Provider, MD  naproxen (NAPROSYN) 500 MG tablet Take 1 tablet (500 mg  total) by mouth 2 (two) times daily. 06/16/14   Linwood Dibbles, MD  predniSONE (DELTASONE) 10 MG tablet 6,5,4,3,2,1 taper Patient not taking: Reported on 06/16/2014 03/23/14   Elson Areas, PA-C  traMADol (ULTRAM) 50 MG tablet Take 1 tablet (50 mg total) by mouth every 6 (six) hours as needed. 06/16/14   Linwood Dibbles, MD    BP 130/77 mmHg  Pulse 62  Temp(Src) 97.5 F (36.4 C) (Oral)  Resp 16  Ht 6' (1.829 m)  Wt 86.183 kg  BMI 25.76 kg/m2  SpO2 99% Physical Exam  Constitutional: He is oriented to person, place, and time. He appears well-developed and well-nourished. No distress.  HENT:  Head: Normocephalic.  Right Ear: External ear normal.  Left Ear: External ear normal.  Nose: Nose lacerations and sinus tenderness present. No septal deviation or nasal septal hematoma. Epistaxis is observed. Right sinus exhibits maxillary sinus tenderness and frontal sinus tenderness. Left sinus exhibits maxillary sinus tenderness and frontal sinus tenderness.    Mouth/Throat: Uvula is midline, oropharynx is clear and moist and mucous membranes are normal.  Blood to nares bilaterally. Diffuse TTP to frontal and maxillary sinuses.  Eyes: Conjunctivae, EOM and lids are normal. Pupils are equal, round, and reactive to light. Right eye exhibits no discharge. Left eye exhibits no discharge. No scleral icterus.  Neck: Normal range of motion. Neck supple.  Cardiovascular: Normal rate, regular rhythm, normal heart sounds, intact  distal pulses and normal pulses.   Pulmonary/Chest: Effort normal and breath sounds normal. No respiratory distress. He has no wheezes. He has no rales.  Abdominal: Soft. Normal appearance and bowel sounds are normal. He exhibits no distension and no mass. There is no tenderness. There is no rigidity, no rebound and no guarding.  Musculoskeletal: Normal range of motion. He exhibits no edema or tenderness.  Neurological: He is alert and oriented to person, place, and time. He has normal  strength. No cranial nerve deficit or sensory deficit.  Skin: Skin is warm, dry and intact. No rash noted. He is not diaphoretic. No erythema. No pallor.  2 cm laceration to bridge of nose.  Psychiatric: He has a normal mood and affect. His speech is normal and behavior is normal.  Nursing note and vitals reviewed.   ED Course  .Marland Kitchen.Laceration Repair Date/Time: 05/17/2015 9:21 AM Performed by: Mady GemmaWESTFALL, Corvin Sorbo C Authorized by: Glean HessWESTFALL, Ladelle Teodoro C Consent: Verbal consent obtained. Risks and benefits: risks, benefits and alternatives were discussed Consent given by: patient Patient understanding: patient states understanding of the procedure being performed Patient consent: the patient's understanding of the procedure matches consent given Procedure consent: procedure consent matches procedure scheduled Relevant documents: relevant documents present and verified Site marked: the operative site was marked Required items: required blood products, implants, devices, and special equipment available Patient identity confirmed: verbally with patient Time out: Immediately prior to procedure a "time out" was called to verify the correct patient, procedure, equipment, support staff and site/side marked as required. Body area: head/neck Location details: nose Laceration length: 2 cm Foreign bodies: no foreign bodies Tendon involvement: none Nerve involvement: none Vascular damage: no Anesthesia: local infiltration Local anesthetic: lidocaine 1% without epinephrine Anesthetic total: 3 ml Patient sedated: no Preparation: Patient was prepped and draped in the usual sterile fashion. Irrigation solution: saline Irrigation method: tap and syringe Amount of cleaning: extensive Debridement: none Degree of undermining: none Skin closure: 6-0 Prolene Number of sutures: 3 Technique: simple Approximation: close Approximation difficulty: simple Dressing: 4x4 sterile gauze Patient tolerance:  Patient tolerated the procedure well with no immediate complications    Labs Review Labs Reviewed - No data to display  Imaging Review Ct Maxillofacial Wo Cm  05/17/2015  CLINICAL DATA:  Patient walked into a wall resulting in a laceration to the bridge of the nose. EXAM: CT MAXILLOFACIAL WITHOUT CONTRAST TECHNIQUE: Multidetector CT imaging of the maxillofacial structures was performed. Multiplanar CT image reconstructions were also generated. A small metallic BB was placed on the right temple in order to reliably differentiate right from left. COMPARISON:  None. FINDINGS: Slightly depressed anterior nasal bone fractures. See for instance image 68 series 4. No other facial fractures are seen. No radiopaque foreign body. Moderate edema throughout the nasal cavity, with swollen turbinates. No blowout injury to the orbits. No sinus air-fluid level. Mild mucosal thickening most notable in the BILATERAL maxillary sinuses. Extensive periodontal disease, with periapical lucencies and dental caries in the maxilla and mandible. This is most notable in the LEFT maxillary incisor region. Negative intracranial compartment. IMPRESSION: Slightly depressed anterior nasal bone fractures. No other facial fracture is evident. Incidental poor dentition. Electronically Signed   By: Elsie StainJohn T Curnes M.D.   On: 05/17/2015 08:03   I have personally reviewed and evaluated these images as part of my medical decision-making.   EKG Interpretation None      MDM   Final diagnoses:  Facial laceration, initial encounter  Nasal bone fracture, open, initial encounter  35 year old male presents with laceration to the bridge of his nose and facial pain after sleepwalking and walking into a wall this morning prior to arrival. Denies headache, lightheadedness, dizziness, additional injury. Reports he is unsure if his tetanus is up to date.  Patient is afebrile. Vital signs stable. On exam, patient has blood in both nares, no  active bleeding. Additionally, he has a 2 cm laceration to the bridge of his nose with associated TTP. No septal hematoma. Patient also has TTP to frontal/maxillary sinuses bilaterally. Normal neuro exam with no focal deficit.   CT maxillofacial remarkable for slightly depressed anterior nasal bone fractures. No other facial fracture. Spoke with patient regarding findings.   Patient discussed with Dr. Denton Lank. Tetanus updated. Laceration irrigated extensively and subsequently repaired. Will discharge with antibiotic and short course of pain medication. Patient to follow-up with ENT for further evaluation and management. Strict return precautions discussed. Patient verbalizes his understanding and is in agreement with plan.  BP 130/77 mmHg  Pulse 62  Temp(Src) 97.5 F (36.4 C) (Oral)  Resp 16  Ht 6' (1.829 m)  Wt 86.183 kg  BMI 25.76 kg/m2  SpO2 99%   Mady Gemma, PA-C 05/17/15 1045  Cathren Laine, MD 05/19/15 [phone]

## 2015-05-17 NOTE — ED Notes (Signed)
Per EMS pt walked into a wall resulting in a laceration to the bridge of his nose.  Denies LOC, neck/back pain.

## 2015-05-17 NOTE — Discharge Instructions (Signed)
1. Medications: keflex, pain medicine, usual home medications 2. Treatment: rest, drink plenty of fluids, ice 3. Follow Up: please followup with this department in 7 days for suture removal and with ENT for discussion of your diagnoses and further evaluation after today's visit; if you do not have a primary care doctor use the phone number listed in your discharge paperwork to find one; please return to the ER for increased pain, redness, swelling, new or worsening symptoms   Head Injury, Adult You have a head injury. Headaches and throwing up (vomiting) are common after a head injury. It should be easy to wake up from sleeping. Sometimes you must stay in the hospital. Most problems happen within the first 24 hours. Side effects may occur up to 7-10 days after the injury.  WHAT ARE THE TYPES OF HEAD INJURIES? Head injuries can be as minor as a bump. Some head injuries can be more severe. More severe head injuries include:  A jarring injury to the brain (concussion).  A bruise of the brain (contusion). This mean there is bleeding in the brain that can cause swelling.  A cracked skull (skull fracture).  Bleeding in the brain that collects, clots, and forms a bump (hematoma). WHEN SHOULD I GET HELP RIGHT AWAY?   You are confused or sleepy.  You cannot be woken up.  You feel sick to your stomach (nauseous) or keep throwing up (vomiting).  Your dizziness or unsteadiness is getting worse.  You have very bad, lasting headaches that are not helped by medicine. Take medicines only as told by your doctor.  You cannot use your arms or legs like normal.  You cannot walk.  You notice changes in the black spots in the center of the colored part of your eye (pupil).  You have clear or bloody fluid coming from your nose or ears.  You have trouble seeing. During the next 24 hours after the injury, you must stay with someone who can watch you. This person should get help right away (call 911 in  the [place] if you start to shake and are not able to control it (have seizures), you pass out, or you are unable to wake up. HOW CAN I PREVENT A HEAD INJURY IN THE FUTURE?  Wear seat belts.  Wear a helmet while bike riding and playing sports like football.  Stay away from dangerous activities around the house. WHEN CAN I RETURN TO NORMAL ACTIVITIES AND ATHLETICS? See your doctor before doing these activities. You should not do normal activities or play contact sports until 1 week after the following symptoms have stopped:  Headache that does not go away.  Dizziness.  Poor attention.  Confusion.  Memory problems.  Sickness to your stomach or throwing up.  Tiredness.  Fussiness.  Bothered by bright lights or loud noises.  Anxiousness or depression.  Restless sleep. MAKE SURE YOU:   Understand these instructions.  Will watch your condition.  Will get help right away if you are not doing well or get worse.   This information is not intended to replace advice given to you by your health care provider. Make sure you discuss any questions you have with your health care provider.   Document Released: 01/21/2008 Document Revised: 02/28/2014 Document Reviewed: 10/15/2012 Elsevier Interactive Patient Education 2016 [employer]  Facial Laceration A facial laceration is a cut on the face. These injuries can be painful and cause bleeding. Some cuts may need to be closed with stitches (sutures), skin adhesive  strips, or wound glue. Cuts usually heal quickly but can leave a scar. It can take 1-2 years for the scar to go away completely. HOME CARE   Only take medicines as told by your doctor.  Follow your doctor's instructions for wound care. For Stitches:  Keep the cut clean and dry.  If you have a bandage (dressing), change it at least once a day. Change the bandage if it gets wet or dirty, or as told by your doctor.  Wash the cut with soap and water 2 times a day. Rinse  the cut with water. Pat it dry with a clean towel.  Put a thin layer of medicated cream on the cut as told by your doctor.  You may shower after the first 24 hours. Do not soak the cut in water until the stitches are removed.  Have your stitches removed as told by your doctor.  Do not wear any makeup until a few days after your stitches are removed. For Skin Adhesive Strips:  Keep the cut clean and dry.  Do not get the strips wet. You may take a bath, but be careful to keep the cut dry.  If the cut gets wet, pat it dry with a clean towel.  The strips will fall off on their own. Do not remove the strips that are still stuck to the cut. For Wound Glue:  You may shower or take baths. Do not soak or scrub the cut. Do not swim. Avoid heavy sweating until the glue falls off on its own. After a shower or bath, pat the cut dry with a clean towel.  Do not put medicine or makeup on your cut until the glue falls off.  If you have a bandage, do not put tape over the glue.  Avoid lots of sunlight or tanning lamps until the glue falls off.  The glue will fall off on its own in 5-10 days. Do not pick at the glue. After Healing:  Put sunscreen on the cut for the first year to reduce your scar. GET HELP IF:  You have a fever. GET HELP RIGHT AWAY IF:   Your cut area gets red, painful, or puffy (swollen).  You see a yellowish-white fluid (pus) coming from the cut.   This information is not intended to replace advice given to you by your health care provider. Make sure you discuss any questions you have with your health care provider.   Document Released: 07/27/2007 Document Revised: 02/28/2014 Document Reviewed: 09/20/2012 Elsevier Interactive Patient Education [employer]

## 2015-06-03 ENCOUNTER — Emergency Department (HOSPITAL_COMMUNITY)
Admission: EM | Admit: 2015-06-03 | Discharge: 2015-06-03 | Disposition: A | Discharge status: Home / Self Care | Payer: Self-pay | Attending: Emergency Medicine | Admitting: Emergency Medicine

## 2015-06-03 ENCOUNTER — Encounter (HOSPITAL_COMMUNITY): Payer: Self-pay | Admitting: Emergency Medicine

## 2015-06-03 DIAGNOSIS — Z880 Allergy status to penicillin: Secondary | ICD-10-CM | POA: Insufficient documentation

## 2015-06-03 DIAGNOSIS — X58XXXD Exposure to other specified factors, subsequent encounter: Secondary | ICD-10-CM | POA: Insufficient documentation

## 2015-06-03 DIAGNOSIS — Z792 Long term (current) use of antibiotics: Secondary | ICD-10-CM | POA: Insufficient documentation

## 2015-06-03 DIAGNOSIS — Z4802 Encounter for removal of sutures: Secondary | ICD-10-CM | POA: Insufficient documentation

## 2015-06-03 DIAGNOSIS — Z791 Long term (current) use of non-steroidal anti-inflammatories (NSAID): Secondary | ICD-10-CM | POA: Insufficient documentation

## 2015-06-03 DIAGNOSIS — S022XXD Fracture of nasal bones, subsequent encounter for fracture with routine healing: Secondary | ICD-10-CM | POA: Insufficient documentation

## 2015-06-03 DIAGNOSIS — F17200 Nicotine dependence, unspecified, uncomplicated: Secondary | ICD-10-CM | POA: Insufficient documentation

## 2015-06-03 NOTE — ED Notes (Signed)
SUture removal to nose, edges well approximated, no drainage/redness

## 2015-06-03 NOTE — ED Provider Notes (Signed)
CSN: [id number]     Arrival date & time 06/03/15  1040 History   First MD Initiated Contact with Patient 06/03/15 1052        HPI Presents requesting suture removal. Pt with facial laceration repaired in ER on 05/17/15. Also with nasal fracture noted at that time. Lost his discharge instructions and is requesting information for an ENT given ongoing nasal pain since injury. No other complaints   Past Medical History  Diagnosis Date  . Dislocated knee    Past Surgical History  Procedure Laterality Date  . Hernia repair    . Appendectomy     No family history on file. Social History  Substance Use Topics  . Smoking status: Current Every Day Smoker -- 0.50 packs/day  . Smokeless tobacco: Not on file  . Alcohol Use: No    Review of Systems  All other systems reviewed and are negative.     Allergies  Amoxicillin  Home Medications   Prior to Admission medications   Medication Sig Start Date End Date Taking? Authorizing Provider  cephALEXin (KEFLEX) 500 MG capsule Take 1 capsule (500 mg total) by mouth 4 (four) times daily. 05/17/15   Mady GemmaElizabeth C Westfall, PA-C  clindamycin (CLEOCIN) 150 MG capsule Take 1 capsule (150 mg total) by mouth every 6 (six) hours. 06/16/14   Linwood DibblesJon Knapp, MD  HYDROcodone-acetaminophen (NORCO/VICODIN) 5-325 MG tablet Take 1 tablet by mouth every 4 (four) hours as needed. 05/17/15   Mady GemmaElizabeth C Westfall, PA-C  ibuprofen (ADVIL,MOTRIN) 200 MG tablet Take 1,000 mg by mouth every 6 (six) hours as needed for headache or moderate pain.    Historical Provider, MD  naproxen (NAPROSYN) 500 MG tablet Take 1 tablet (500 mg total) by mouth 2 (two) times daily. 06/16/14   Linwood DibblesJon Knapp, MD  predniSONE (DELTASONE) 10 MG tablet 6,5,4,3,2,1 taper Patient not taking: Reported on 06/16/2014 03/23/14   Elson AreasLeslie K Sofia, PA-C  traMADol (ULTRAM) 50 MG tablet Take 1 tablet (50 mg total) by mouth every 6 (six) hours as needed. 06/16/14   Linwood DibblesJon Knapp, MD   BP 143/86 mmHg  Pulse 80   Temp(Src) 97.4 F (36.3 C) (Oral)  Resp 16  SpO2 99% Physical Exam  Constitutional: He is oriented to person, place, and time. He appears well-developed and well-nourished.  HENT:  Head: Normocephalic.  Nasal deformity. Sutures in places. No surrounding erythema or drainage  Eyes: EOM are normal.  Neck: Normal range of motion.  Pulmonary/Chest: Effort normal.  Abdominal: He exhibits no distension.  Musculoskeletal: Normal range of motion.  Neurological: He is alert and oriented to person, place, and time.  Psychiatric: He has a normal mood and affect.  Nursing note and vitals reviewed.   ED Course  Procedures (including critical care time)    SUTURE REMOVAL Performed by: Lyanne CoAMPOS,Aydeen Blume M Consent: Verbal consent obtained. Patient identity confirmed: provided demographic data Time out: Immediately prior to procedure a "time out" was called to verify the correct patient, procedure, equipment, support staff and site/side marked as required. Location: nasal bridge Wound Appearance: clean Sutures/Staples Removed: 3 Patient tolerance: Patient tolerated the procedure well with no immediate complications.     Labs Review Labs Reviewed - No data to display  Imaging Review No results found. I have personally reviewed and evaluated these images and lab results as part of my medical decision-making.   EKG Interpretation None      MDM   Final diagnoses:  Visit for suture removal  Nasal fracture, with routine healing,  subsequent encounter    No signs of infection. Sutures removed without comlication    Azalia Bilis, MD 06/03/15 1106

## 2015-06-03 NOTE — Discharge Instructions (Signed)

## 2015-06-11 ENCOUNTER — Emergency Department (HOSPITAL_COMMUNITY)
Admission: EM | Admit: 2015-06-11 | Discharge: 2015-06-11 | Disposition: A | Discharge status: Home / Self Care | Payer: Self-pay | Attending: Emergency Medicine | Admitting: Emergency Medicine

## 2015-06-11 ENCOUNTER — Encounter (HOSPITAL_COMMUNITY): Payer: Self-pay

## 2015-06-11 DIAGNOSIS — F17200 Nicotine dependence, unspecified, uncomplicated: Secondary | ICD-10-CM | POA: Insufficient documentation

## 2015-06-11 DIAGNOSIS — H6123 Impacted cerumen, bilateral: Secondary | ICD-10-CM | POA: Insufficient documentation

## 2015-06-11 DIAGNOSIS — K0889 Other specified disorders of teeth and supporting structures: Secondary | ICD-10-CM | POA: Insufficient documentation

## 2015-06-11 DIAGNOSIS — Z87828 Personal history of other (healed) physical injury and trauma: Secondary | ICD-10-CM | POA: Insufficient documentation

## 2015-06-11 DIAGNOSIS — Z791 Long term (current) use of non-steroidal anti-inflammatories (NSAID): Secondary | ICD-10-CM | POA: Insufficient documentation

## 2015-06-11 DIAGNOSIS — Z79891 Long term (current) use of opiate analgesic: Secondary | ICD-10-CM | POA: Insufficient documentation

## 2015-06-11 DIAGNOSIS — K08409 Partial loss of teeth, unspecified cause, unspecified class: Secondary | ICD-10-CM | POA: Insufficient documentation

## 2015-06-11 DIAGNOSIS — Z792 Long term (current) use of antibiotics: Secondary | ICD-10-CM | POA: Insufficient documentation

## 2015-06-11 DIAGNOSIS — Z880 Allergy status to penicillin: Secondary | ICD-10-CM | POA: Insufficient documentation

## 2015-06-11 MED ORDER — CLINDAMYCIN HCL 150 MG PO CAPS: 150.0000 mg | ORAL_CAPSULE | Freq: Four times a day (QID) | ORAL | Status: DC

## 2015-06-11 MED ORDER — IBUPROFEN 800 MG PO TABS: 800.0000 mg | ORAL_TABLET | Freq: Three times a day (TID) | ORAL | Status: DC

## 2015-06-11 MED ORDER — BUPIVACAINE-EPINEPHRINE (PF) 0.5% -1:200000 IJ SOLN
1.8000 mL | Freq: Once | INTRAMUSCULAR | Status: AC
  Administered 2015-06-11: 1.8 mL
  Filled 2015-06-11: qty 1.8

## 2015-06-11 MED ORDER — IBUPROFEN 800 MG PO TABS
800.0000 mg | ORAL_TABLET | Freq: Once | ORAL | Status: AC
  Administered 2015-06-11: 800 mg via ORAL
  Filled 2015-06-11: qty 1

## 2015-06-11 NOTE — ED Notes (Signed)
Pt was just seen earlier for same.  States he is unable to fill his rxs because he does not get paid until Monday.

## 2015-06-11 NOTE — ED Notes (Signed)
Pt markedly more animated. Ambulated down the hall

## 2015-06-11 NOTE — Progress Notes (Addendum)
After seeing another pt Cm went to knock on pt TCU rm door #28 and Pt noted screaming out and hitting the wall in his room with his fist  Cm inquired if he was okay Pt states the dental pain is causing him issues  CM spoke with [place] who [place].  CM discussed and provided written information for uninsured accepting pcps, discussed the importance of pcp vs EDP services for f/u care, www.needymeds.org, www.goodrx.com, discounted pharmacies and other [place] resources such as [facility] , [facility], affordable care act, financial assistance, uninsured dental services, [place] med assist, DSS and  health department  Reviewed resources for [place] uninsured accepting pcps like [facility], family medicine [facility] [address], [facility], [facility], local urgent care centers, [facility], [facility], general medical clinics, [facility] services [facility], [facility] urgent [facility] plus others, medication resources, [facility] out patient pharmacies and housing Pt voiced understanding and appreciation of resources provided   Provided [facility] contact information Pt agreed to a referral Cm completed referral Pt to be contact by [facility] clinical liason

## 2015-06-11 NOTE — Care Management Note (Signed)
Case Management Note  Patient Details  Name: Alvin Anderson MRN: [id number] Date of Birth: 09/16/1980  Subjective/Objective:     [facility].  Patient reports unable to afford medication until he gets paid on Monday.               Action/Plan:  [facility]  Explained to patient that this is a one time a year assist and will not be eligible for assistance again until this time next year.  [facility]ained [facility] within seven days to get prescriptions filled ASAP,  Patient agreeable to copay of 3 dollars.  EDCM explained cannot assist with cost of motrin to try [employer] etc.  Patient thankful for assistance.  No further EDCM needs at this time.   Expected Discharge Date:                  Expected Discharge Plan:     In-House Referral:     Discharge planning Services  CM Consult, MATCH Program  Post Acute Care Choice:    Choice offered to:     DME Arranged:    DME Agency:     HH Arranged:    HH Agency:     Status of Service:  Completed, signed off  Medicare Important Message Given:    Date Medicare IM Given:    Medicare IM give by:    Date Additional Medicare IM Given:    Additional Medicare Important Message give by:     If discussed at Long Length of Stay Meetings, dates discussed:    Additional CommentsRadford Pax:  Anaia Frith, RN 06/11/2015, 5:25 PM

## 2015-06-11 NOTE — Progress Notes (Signed)
Entered in d/c instructions  Please use the resources provided to you in emergency room by case manager to assist with doctor for follow up A referral for you has been sent to [facility] if you have not received a call in 3 days you may contact them Call Karen Andrianos at [phone] Tuesday-Friday [email or web address] These [place] uninsured resources provide possible primary care providers, resources for discounted medications, housing, dental resources, affordable care act information, plus other resources for [place]   

## 2015-06-11 NOTE — ED Provider Notes (Signed)
History  By signing my name below, I, [name], attest that this documentation has been prepared under the direction and in the presence of [name], PA-C. Electronically Signed: [name], ED Scribe. 06/11/2015. 4:57 PM.  Chief Complaint  Patient presents with  . Dental Pain   The history is provided by the patient and medical records. No language interpreter was used.    HPI Comments:  Alvin Anderson is a 35 y.o. male who presents to the Emergency Department complaining of severe left upper dental pain that began approximately 12 hours ago. He states he was just seen here earlier today and received a dental block but it has now worn off. Pt states he cannot afford to purchase his prescriptions at this time. He denies any new symptoms.   Past Medical History  Diagnosis Date  . Dislocated knee    Past Surgical History  Procedure Laterality Date  . Hernia repair    . Appendectomy     History reviewed. No pertinent family history. Social History  Substance Use Topics  . Smoking status: Current Every Day Smoker -- 0.50 packs/day  . Smokeless tobacco: None  . Alcohol Use: No    Review of Systems  HENT: Positive for dental problem.     Allergies  Amoxicillin  Home Medications   Prior to Admission medications   Medication Sig Start Date End Date Taking? Authorizing Provider  cephALEXin (KEFLEX) 500 MG capsule Take 1 capsule (500 mg total) by mouth 4 (four) times daily. 05/17/15   Mady Gemma, PA-C  clindamycin (CLEOCIN) 150 MG capsule Take 1 capsule (150 mg total) by mouth every 6 (six) hours. 06/11/15   [name], PA-C  HYDROcodone-acetaminophen (NORCO/VICODIN) 5-325 MG tablet Take 1 tablet by mouth every 4 (four) hours as needed. 05/17/15   Mady Gemma, PA-C  ibuprofen (ADVIL,MOTRIN) 800 MG tablet Take 1 tablet (800 mg total) by mouth 3 (three) times daily. 06/11/15   [name], PA-C  naproxen (NAPROSYN) 500 MG tablet Take 1 tablet (500  mg total) by mouth 2 (two) times daily. 06/16/14   Linwood Dibbles, MD  predniSONE (DELTASONE) 10 MG tablet 6,5,4,3,2,1 taper Patient not taking: Reported on 06/16/2014 03/23/14   Elson Areas, PA-C  traMADol (ULTRAM) 50 MG tablet Take 1 tablet (50 mg total) by mouth every 6 (six) hours as needed. 06/16/14   Linwood Dibbles, MD   Triage Vitals: BP 155/87 mmHg  Pulse 67  Temp(Src) 97.9 F (36.6 C) (Oral)  Resp 18  SpO2 99% Physical Exam  Constitutional: He is oriented to person, place, and time. He appears well-developed and well-nourished.  HENT:  Head: Normocephalic and atraumatic.  Bilateral cerumen impactions. Unable to visualize TMs. Tenderness to left upper gumline without gingival erythema. No obvious abscess. Tooth # 12-14 missing. No facial swelling.  Eyes: EOM are normal.  Neck: Normal range of motion.  Cardiovascular: Normal rate.   Pulmonary/Chest: Effort normal.  Musculoskeletal: Normal range of motion.  Neurological: He is alert and oriented to person, place, and time.  Skin: Skin is warm and dry.  Psychiatric: He has a normal mood and affect. His behavior is normal.  Nursing note and vitals reviewed.   ED Course  Procedures (including critical care time) DIAGNOSTIC STUDIES: Oxygen Saturation is 99% on RA, normal by my interpretation.   COORDINATION OF CARE: 4:56 PM- Will speak with case manager about assistance with prescriptions. Will give Ibuprofen prior to discharge. Pt verbalizes understanding and agrees to plan.  Medications  ibuprofen (ADVIL,MOTRIN) tablet 800 mg (not administered)     MDM   Final diagnoses:  Pain, dental    BP 155/87 mmHg  Pulse 70  Temp(Src) 97.9 F (36.6 C) (Oral)  Resp 18  SpO2 100%  I personally performed the services described in this documentation, which was scribed in my presence. The recorded information has been reviewed and is accurate.       Fayrene HelperBowie Chamari Cutbirth, PA-C 06/11/15 2110  Lyndal Pulleyaniel Knott, MD 06/12/15 [phone]

## 2015-06-11 NOTE — Discharge Instructions (Signed)

## 2015-06-11 NOTE — ED Notes (Signed)
Pt here with dental pain to left side of mouth.  Started this morning.  Using topical gel for pain relief.

## 2015-06-11 NOTE — ED Notes (Signed)
Patient was just released today with left dental pain. Patient states the pain is unbearable. Patient states he did not get his prescriptions filled because he does not get paid 4 days.

## 2015-06-11 NOTE — ED Provider Notes (Signed)
History  By signing my name below, I, Alvin Anderson, attest that this documentation has been prepared under the direction and in the presence of Alvin Helper, PA-C. Electronically Signed: Karle Anderson, ED Scribe. 06/11/2015. 12:23 PM.  Chief Complaint  Patient presents with  . Dental Pain   HPI  HPI Comments:  Alvin Anderson is a 35 y.o. male who presents to the Emergency Department complaining of severe left upper dental pain that began approximately 12 hours ago. He reports left sided facial swelling. He rates the pain at "12/10". He has been applying Anbesol topical gel with no significant relief of the pain. Cold air and cold drinks increase the pain. He denies alleviating factors. He denies CP, SOB, inability to swallow, fever, chills, nausea, vomiting, otalgia. He does not have a Education officer, community.  Past Medical History  Diagnosis Date  . Dislocated knee    Past Surgical History  Procedure Laterality Date  . Hernia repair    . Appendectomy     History reviewed. No pertinent family history. Social History  Substance Use Topics  . Smoking status: Current Every Day Smoker -- 0.50 packs/day  . Smokeless tobacco: None  . Alcohol Use: No    Review of Systems  Constitutional: Negative for fever and chills.  HENT: Positive for dental problem and facial swelling.   Respiratory: Negative for shortness of breath.   Cardiovascular: Negative for chest pain.  Gastrointestinal: Negative for nausea and vomiting.    Allergies  Amoxicillin  Home Medications   Prior to Admission medications   Medication Sig Start Date End Date Taking? Authorizing Provider  cephALEXin (KEFLEX) 500 MG capsule Take 1 capsule (500 mg total) by mouth 4 (four) times daily. 05/17/15   Mady Gemma, PA-C  clindamycin (CLEOCIN) 150 MG capsule Take 1 capsule (150 mg total) by mouth every 6 (six) hours. 06/16/14   Linwood Dibbles, MD  HYDROcodone-acetaminophen (NORCO/VICODIN) 5-325 MG tablet Take 1 tablet by  mouth every 4 (four) hours as needed. 05/17/15   Mady Gemma, PA-C  ibuprofen (ADVIL,MOTRIN) 200 MG tablet Take 1,000 mg by mouth every 6 (six) hours as needed for headache or moderate pain.    Historical Provider, MD  naproxen (NAPROSYN) 500 MG tablet Take 1 tablet (500 mg total) by mouth 2 (two) times daily. 06/16/14   Linwood Dibbles, MD  predniSONE (DELTASONE) 10 MG tablet 6,5,4,3,2,1 taper Patient not taking: Reported on 06/16/2014 03/23/14   Elson Areas, PA-C  traMADol (ULTRAM) 50 MG tablet Take 1 tablet (50 mg total) by mouth every 6 (six) hours as needed. 06/16/14   Linwood Dibbles, MD   Triage Vitals: BP 146/103 mmHg  Pulse 93  Temp(Src) 98.2 F (36.8 C) (Oral)  Resp 18  SpO2 100% Physical Exam  Constitutional: He is oriented to person, place, and time. He appears well-developed and well-nourished.  HENT:  Head: Normocephalic and atraumatic.  Mouth/Throat: No trismus in the jaw. No dental abscesses.  Bilateral cerumen impactions. Unable to visualize TMs. Tenderness to left upper gumline without gingival erythema. No obvious abscess. Tooth # 12-14 missing. No facial swelling.  Eyes: EOM are normal.  Neck: Normal range of motion.  Cardiovascular: Normal rate.   Pulmonary/Chest: Effort normal.  Musculoskeletal: Normal range of motion.  Neurological: He is alert and oriented to person, place, and time.  Skin: Skin is warm and dry.  Psychiatric: He has a normal mood and affect. His behavior is normal.  Nursing note and vitals reviewed.   ED Course  .  Nerve Block Date/Time: 06/11/2015 12:43 PM Performed by: Alvin HelperRAN, Jonus Coble Authorized by: Alvin HelperRAN, Decoda Van Consent: Verbal consent obtained. Risks and benefits: risks, benefits and alternatives were discussed Consent given by: patient Patient understanding: patient states understanding of the procedure being performed Patient consent: the patient's understanding of the procedure matches consent given Patient identity confirmed: verbally with  patient and arm band Time out: Immediately prior to procedure a "time out" was called to verify the correct patient, procedure, equipment, support staff and site/side marked as required. Indications: pain relief Body area: face/mouth Nerve: infraorbital Patient sedated: no Patient position: sitting Needle gauge: 27 G Location technique: anatomical landmarks Local anesthetic: bupivacaine 0.5% with epinephrine Anesthetic total: 1.8 ml Outcome: pain improved Patient tolerance: Patient tolerated the procedure well with no immediate complications   (including critical care time) DIAGNOSTIC STUDIES: Oxygen Saturation is 100% on RA, normal by my interpretation.   COORDINATION OF CARE: 12:19 PM- Will administer dental block. Will prescribe clinda, ibuprofen and give referral to dentist. Pt verbalizes understanding and agrees to plan.      Medications - No data to display    MDM   Final diagnoses:  Pain, dental    BP 146/103 mmHg  Pulse 93  Temp(Src) 98.2 F (36.8 C) (Oral)  Resp 18  SpO2 100%   I personally performed the services described in this documentation, which was scribed in my presence. The recorded information has been reviewed and is accurate.       Alvin HelperBowie Cleburn Maiolo, PA-C 06/11/15 1245  Pricilla LovelessScott Goldston, MD 06/13/15 [phone]

## 2015-06-11 NOTE — Discharge Instructions (Signed)

## 2016-05-05 ENCOUNTER — Emergency Department (HOSPITAL_COMMUNITY)
Admission: EM | Admit: 2016-05-05 | Discharge: 2016-05-06 | Disposition: A | Discharge status: Home / Self Care | Payer: Self-pay | Attending: Emergency Medicine | Admitting: Emergency Medicine

## 2016-05-05 ENCOUNTER — Encounter (HOSPITAL_COMMUNITY): Payer: Self-pay

## 2016-05-05 DIAGNOSIS — Z79899 Other long term (current) drug therapy: Secondary | ICD-10-CM | POA: Insufficient documentation

## 2016-05-05 DIAGNOSIS — K047 Periapical abscess without sinus: Secondary | ICD-10-CM | POA: Insufficient documentation

## 2016-05-05 DIAGNOSIS — K029 Dental caries, unspecified: Secondary | ICD-10-CM | POA: Insufficient documentation

## 2016-05-05 DIAGNOSIS — F17200 Nicotine dependence, unspecified, uncomplicated: Secondary | ICD-10-CM | POA: Insufficient documentation

## 2016-05-05 MED ORDER — ONDANSETRON 4 MG PO TBDP
4.0000 mg | ORAL_TABLET | Freq: Once | ORAL | Status: AC
  Administered 2016-05-05: 4 mg via ORAL
  Filled 2016-05-05: qty 1

## 2016-05-05 MED ORDER — OXYCODONE-ACETAMINOPHEN 5-325 MG PO TABS
1.0000 | ORAL_TABLET | Freq: Once | ORAL | Status: AC
  Administered 2016-05-05: 1 via ORAL
  Filled 2016-05-05: qty 1

## 2016-05-05 MED ORDER — TRAMADOL HCL 50 MG PO TABS: 50.0000 mg | ORAL_TABLET | Freq: Four times a day (QID) | ORAL | 0 refills | Status: DC | PRN

## 2016-05-05 MED ORDER — NAPROXEN 500 MG PO TABS: 500.0000 mg | ORAL_TABLET | Freq: Two times a day (BID) | ORAL | 0 refills | Status: DC

## 2016-05-05 MED ORDER — IBUPROFEN 400 MG PO TABS
600.0000 mg | ORAL_TABLET | Freq: Once | ORAL | Status: AC
  Administered 2016-05-05: 600 mg via ORAL
  Filled 2016-05-05: qty 1

## 2016-05-05 MED ORDER — CLINDAMYCIN HCL 150 MG PO CAPS
300.0000 mg | ORAL_CAPSULE | Freq: Once | ORAL | Status: AC
  Administered 2016-05-05: 300 mg via ORAL
  Filled 2016-05-05: qty 2

## 2016-05-05 MED ORDER — CLINDAMYCIN HCL 150 MG PO CAPS: 300.0000 mg | ORAL_CAPSULE | Freq: Three times a day (TID) | ORAL | 0 refills | Status: DC

## 2016-05-05 NOTE — ED Provider Notes (Signed)
[facility]-EMERGENCY DEPT Provider Note   By signing my name below, I, Earmon PhoenixJennifer Waddell, attest that this documentation has been prepared under the direction and in the presence of [facility] Joycelin Radloff, [place]. Electronically Signed: Earmon PhoenixJennifer Waddell, ED Scribe. 05/05/16. 10:25 PM.    History   Chief Complaint Chief Complaint  Patient presents with  . Dental Pain   The history is provided by the patient and medical records. No language interpreter was used.    Alvin Anderson is a 36 y.o. male who presents to the Emergency Department complaining of recurrent severe bilateral upper dental pain that began two days ago. He reports associated gum swelling and pain. He states he has been having issues with these teeth for the past 5-6 years. He has not taken anything for pain relief. Touching the areas, air and eating increase the pain. He denies alleviating factors. He denies fever, chills, nausea, vomiting, difficulty breathing or swallowing, drooling or trismus.   Past Medical History:  Diagnosis Date  . Dislocated knee     There are no active problems to display for this patient.   Past Surgical History:  Procedure Laterality Date  . APPENDECTOMY    . HERNIA REPAIR         Home Medications    Prior to Admission medications   Medication Sig Start Date End Date Taking? Authorizing Provider  cephALEXin (KEFLEX) 500 MG capsule Take 1 capsule (500 mg total) by mouth 4 (four) times daily. 05/17/15   Mady GemmaElizabeth C Westfall, PA-C  clindamycin (CLEOCIN) 150 MG capsule Take 2 capsules (300 mg total) by mouth 3 (three) times daily. 05/05/16   Salim Forero Orlene OchM Ceaser Ebeling, NP  HYDROcodone-acetaminophen (NORCO/VICODIN) 5-325 MG tablet Take 1 tablet by mouth every 4 (four) hours as needed. 05/17/15   Mady GemmaElizabeth C Westfall, PA-C  ibuprofen (ADVIL,MOTRIN) 800 MG tablet Take 1 tablet (800 mg total) by mouth 3 (three) times daily. 06/11/15   Fayrene HelperBowie Tran, PA-C  naproxen (NAPROSYN) 500 MG tablet Take 1 tablet (500 mg total) by mouth  2 (two) times daily. 05/05/16   Myranda Pavone Orlene OchM Davone Shinault, NP  traMADol (ULTRAM) 50 MG tablet Take 1 tablet (50 mg total) by mouth every 6 (six) hours as needed. 05/05/16   Kaydin Labo Orlene OchM Lundyn Coste, NP    Family History No family history on file.  Social History Social History  Substance Use Topics  . Smoking status: Current Every Day Smoker    Packs/day: 0.50  . Smokeless tobacco: Never Used  . Alcohol use No     Allergies   Amoxicillin   Review of Systems Review of Systems  Constitutional: Negative for chills and fever.  HENT: Positive for dental problem. Negative for drooling and trouble swallowing.   Respiratory: Negative for shortness of breath.   Gastrointestinal: Negative for nausea and vomiting.     Physical Exam Updated Vital Signs BP (!) 146/103 (BP Location: Right Arm)   Pulse 67   Temp 98.3 F (36.8 C) (Oral)   Resp 16   Ht 6' (1.829 m)   Wt 184 lb (83.5 kg)   SpO2 96%   BMI 24.95 kg/m   Physical Exam  Constitutional: He appears well-developed and well-nourished.  HENT:  Mouth/Throat: Uvula is midline, oropharynx is clear and moist and mucous membranes are normal. No trismus in the jaw. Abnormal dentition. Dental abscesses and dental caries present.  Multiple dental carries in right upper molars. Swelling consistent with dental abscess. Multiple dental carries to left and right lower jaw.  Eyes: EOM are  normal.  Neck: Neck supple.  Cardiovascular: Normal rate.   Pulmonary/Chest: Effort normal.  Musculoskeletal: Normal range of motion.  Lymphadenopathy:    He has no cervical adenopathy.  Neurological: He is alert.  Skin: Skin is warm and dry.  Psychiatric: He has a normal mood and affect.  Nursing note and vitals reviewed.    ED Treatments / Results  DIAGNOSTIC STUDIES: Oxygen Saturation is 96% on RA, adequate by my interpretation.   COORDINATION OF CARE: 10:24 PM- Will order antibiotics and pain medication. Pt verbalizes understanding and agrees to  plan.  Medications  oxyCODONE-acetaminophen (PERCOCET/ROXICET) 5-325 MG per tablet 1 tablet (1 tablet Oral Given 05/05/16 2250)  clindamycin (CLEOCIN) capsule 300 mg (300 mg Oral Given 05/05/16 2250)  ibuprofen (ADVIL,MOTRIN) tablet 600 mg (600 mg Oral Given 05/05/16 2250)  ondansetron (ZOFRAN-ODT) disintegrating tablet 4 mg (4 mg Oral Given 05/05/16 2251)    Labs (all labs ordered are listed, but only abnormal results are displayed) Labs Reviewed - No data to display  Radiology No results found.  Procedures Procedures (including critical care time)  Medications Ordered in ED Medications  oxyCODONE-acetaminophen (PERCOCET/ROXICET) 5-325 MG per tablet 1 tablet (1 tablet Oral Given 05/05/16 2250)  clindamycin (CLEOCIN) capsule 300 mg (300 mg Oral Given 05/05/16 2250)  ibuprofen (ADVIL,MOTRIN) tablet 600 mg (600 mg Oral Given 05/05/16 2250)  ondansetron (ZOFRAN-ODT) disintegrating tablet 4 mg (4 mg Oral Given 05/05/16 2251)     Initial Impression / Assessment and Plan / ED Course  I have reviewed the triage vital signs and the nursing notes Patient with dentalgia to bilateral upper molars that began two days ago. No abscess requiring immediate incision and drainage. Exam not concerning for Ludwig's angina or pharyngeal abscess. Will treat with Clindamycin and pain medication. Pt instructed to follow-up with dentist. Discussed return precautions. Pt safe for discharge.  I personally performed the services described in this documentation, which was scribed in my presence. The recorded information has been reviewed and is accurate.   Final Clinical Impressions(s) / ED Diagnoses   Final diagnoses:  Dental abscess  Infected dental caries    New Prescriptions Discharge Medication List as of 05/05/2016 11:38 PM       [facility], NP 05/06/16 1610    Dione Booze, MD 05/07/16 [phone]

## 2016-05-05 NOTE — ED Triage Notes (Signed)
Bilateral top molar pain since Tuesday. Pt denies fevers or chills. No facial swelling

## 2016-05-05 NOTE — ED Notes (Signed)
See EDP assessment 

## 2016-05-05 NOTE — Discharge Instructions (Signed)
Follow-up with a dentist as soon as possible.

## 2016-05-11 NOTE — ED Notes (Signed)
05/11/16  Pt. Called and requested referral to dentist.  He was given information upon discharge, but [place]   Pt. Was not able to go there.  Spoke with Durward Mallardamille , Case manager, received information about dental clinics .  Called pt. With the information.   Pt. Was very grateful.

## 2016-06-10 ENCOUNTER — Encounter (HOSPITAL_COMMUNITY): Payer: Self-pay | Admitting: *Deleted

## 2016-06-10 ENCOUNTER — Emergency Department (HOSPITAL_COMMUNITY)
Admission: EM | Admit: 2016-06-10 | Discharge: 2016-06-10 | Disposition: A | Discharge status: Home / Self Care | Payer: Self-pay | Attending: Emergency Medicine | Admitting: Emergency Medicine

## 2016-06-10 DIAGNOSIS — S86812A Strain of other muscle(s) and tendon(s) at lower leg level, left leg, initial encounter: Secondary | ICD-10-CM

## 2016-06-10 DIAGNOSIS — X509XXA Other and unspecified overexertion or strenuous movements or postures, initial encounter: Secondary | ICD-10-CM | POA: Insufficient documentation

## 2016-06-10 DIAGNOSIS — Y999 Unspecified external cause status: Secondary | ICD-10-CM | POA: Insufficient documentation

## 2016-06-10 DIAGNOSIS — Y9389 Activity, other specified: Secondary | ICD-10-CM | POA: Insufficient documentation

## 2016-06-10 DIAGNOSIS — S86112A Strain of other muscle(s) and tendon(s) of posterior muscle group at lower leg level, left leg, initial encounter: Secondary | ICD-10-CM | POA: Insufficient documentation

## 2016-06-10 DIAGNOSIS — Y929 Unspecified place or not applicable: Secondary | ICD-10-CM | POA: Insufficient documentation

## 2016-06-10 DIAGNOSIS — F17200 Nicotine dependence, unspecified, uncomplicated: Secondary | ICD-10-CM | POA: Insufficient documentation

## 2016-06-10 MED ORDER — OXYCODONE-ACETAMINOPHEN 5-325 MG PO TABS
ORAL_TABLET | ORAL | Status: AC
  Filled 2016-06-10: qty 1

## 2016-06-10 MED ORDER — OXYCODONE-ACETAMINOPHEN 5-325 MG PO TABS
1.0000 | ORAL_TABLET | Freq: Once | ORAL | Status: AC
  Administered 2016-06-10: 1 via ORAL

## 2016-06-10 NOTE — ED Provider Notes (Signed)
[facility]-EMERGENCY DEPT Provider Note   CSN: [id number] Arrival date & time: 06/10/16  1653     History   Chief Complaint Chief Complaint  Patient presents with  . Leg Pain    HPI Alvin Anderson is a 36 y.o. male.  Patient with no significant medical process presents with sudden calf tenderness. Patient was standing and turned and felt a sudden pop. Patient has no blood clot risk factors. Pain worse with movement of his calf muscle. No history of similar.      Past Medical History:  Diagnosis Date  . Dislocated knee     There are no active problems to display for this patient.   Past Surgical History:  Procedure Laterality Date  . APPENDECTOMY    . HERNIA REPAIR         Home Medications    Prior to Admission medications   Medication Sig Start Date End Date Taking? Authorizing Provider  cephALEXin (KEFLEX) 500 MG capsule Take 1 capsule (500 mg total) by mouth 4 (four) times daily. 05/17/15   Mady Gemma, PA-C  clindamycin (CLEOCIN) 150 MG capsule Take 2 capsules (300 mg total) by mouth 3 (three) times daily. 05/05/16   Hope Orlene Och, NP  HYDROcodone-acetaminophen (NORCO/VICODIN) 5-325 MG tablet Take 1 tablet by mouth every 4 (four) hours as needed. 05/17/15   Mady Gemma, PA-C  ibuprofen (ADVIL,MOTRIN) 800 MG tablet Take 1 tablet (800 mg total) by mouth 3 (three) times daily. 06/11/15   [name], PA-C  naproxen (NAPROSYN) 500 MG tablet Take 1 tablet (500 mg total) by mouth 2 (two) times daily. 05/05/16   Hope Orlene Och, NP  traMADol (ULTRAM) 50 MG tablet Take 1 tablet (50 mg total) by mouth every 6 (six) hours as needed. 05/05/16   Hope Orlene Och, NP    Family History No family history on file.  Social History Social History  Substance Use Topics  . Smoking status: Current Every Day Smoker    Packs/day: 0.50  . Smokeless tobacco: Never Used  . Alcohol use No     Allergies   Amoxicillin   Review of Systems Review of Systems    Constitutional: Negative for fever.  Musculoskeletal: Positive for gait problem.  Skin: Negative for rash.  Neurological: Negative for weakness.     Physical Exam Updated Vital Signs BP (!) 138/95 (BP Location: Left Arm)   Pulse 100   Temp 97.7 F (36.5 C) (Oral)   Resp 16   SpO2 99%   Physical Exam  Constitutional: He appears well-developed and well-nourished.  Pulmonary/Chest: Effort normal.  Musculoskeletal: He exhibits tenderness. He exhibits no edema or deformity.  Patient has tenderness to palpation to left calf muscle and with flexion and extension of the left ankle. No effusion or involvement of the knee or ankle joints. Achilles tendon intact with flexion-extension of left ankle. Difficult exam due to pain in the calf. Neurovascularly intact left leg.  Neurological: He is alert.  Skin: Skin is warm. No erythema.  Nursing note and vitals reviewed.    ED Treatments / Results  Labs (all labs ordered are listed, but only abnormal results are displayed) Labs Reviewed - No data to display  EKG  EKG Interpretation None       Radiology No results found.  Procedures Procedures (including critical care time)  Medications Ordered in ED Medications  oxyCODONE-acetaminophen (PERCOCET/ROXICET) 5-325 MG per tablet (not administered)  oxyCODONE-acetaminophen (PERCOCET/ROXICET) 5-325 MG per tablet 1 tablet (1 tablet Oral  Given 06/10/16 1711)     Initial Impression / Assessment and Plan / ED Course  I have reviewed the triage vital signs and the nursing notes.  Pertinent labs & imaging results that were available during my care of the patient were reviewed by me and considered in my medical decision making (see chart for details).    Clinical concern for partial soleus/gastrocnemius tear. Discussed crutches and follow-up with sports medicine. Work note given. Pain meds given. No bone tenderness.   Results and differential diagnosis were discussed with the  patient/parent/guardian. Xrays were independently reviewed by myself.  Close follow up outpatient was discussed, comfortable with the plan.   Medications  oxyCODONE-acetaminophen (PERCOCET/ROXICET) 5-325 MG per tablet (not administered)  oxyCODONE-acetaminophen (PERCOCET/ROXICET) 5-325 MG per tablet 1 tablet (1 tablet Oral Given 06/10/16 1711)    Vitals:   06/10/16 1700  BP: (!) 138/95  Pulse: 100  Resp: 16  Temp: 97.7 F (36.5 C)  TempSrc: Oral  SpO2: 99%    Final diagnoses:  Strain of calf muscle, left, initial encounter     Final Clinical Impressions(s) / ED Diagnoses   Final diagnoses:  Strain of calf muscle, left, initial encounter    New Prescriptions New Prescriptions   No medications on file     Blane Ohara, MD 06/10/16 1735

## 2016-06-10 NOTE — ED Notes (Signed)
Dr zavitz at bedside 

## 2016-06-10 NOTE — ED Triage Notes (Signed)
pt states that he was standing and turned yesterday when he felt a pop in his left calf. Pt states that he has not been able to bear weight on his left leg.

## 2016-06-10 NOTE — Discharge Instructions (Signed)
Ice, motrin, tylenol and follow up next week for assessment. Gradually bear weight as tolerated.  If you were given medicines take as directed.  If you are on coumadin or contraceptives realize their levels and effectiveness is altered by many different medicines.  If you have any reaction (rash, tongues swelling, other) to the medicines stop taking and see a physician.    If your blood pressure was elevated in the ER make sure you follow up for management with a primary doctor or return for chest pain, shortness of breath or stroke symptoms.  Please follow up as directed and return to the ER or see a physician for new or worsening symptoms.  Thank you. Vitals:   06/10/16 1700  BP: (!) 138/95  Pulse: 100  Resp: 16  Temp: 97.7 F (36.5 C)  TempSrc: Oral  SpO2: 99%

## 2016-06-13 ENCOUNTER — Emergency Department (HOSPITAL_COMMUNITY)
Admission: EM | Admit: 2016-06-13 | Discharge: 2016-06-13 | Disposition: A | Discharge status: Home / Self Care | Payer: Self-pay | Attending: Emergency Medicine | Admitting: Emergency Medicine

## 2016-06-13 ENCOUNTER — Emergency Department (HOSPITAL_COMMUNITY): Payer: Self-pay

## 2016-06-13 ENCOUNTER — Encounter (HOSPITAL_COMMUNITY): Payer: Self-pay | Admitting: Emergency Medicine

## 2016-06-13 DIAGNOSIS — Z79899 Other long term (current) drug therapy: Secondary | ICD-10-CM | POA: Insufficient documentation

## 2016-06-13 DIAGNOSIS — Y929 Unspecified place or not applicable: Secondary | ICD-10-CM | POA: Insufficient documentation

## 2016-06-13 DIAGNOSIS — F17200 Nicotine dependence, unspecified, uncomplicated: Secondary | ICD-10-CM | POA: Insufficient documentation

## 2016-06-13 DIAGNOSIS — Y999 Unspecified external cause status: Secondary | ICD-10-CM | POA: Insufficient documentation

## 2016-06-13 DIAGNOSIS — X501XXA Overexertion from prolonged static or awkward postures, initial encounter: Secondary | ICD-10-CM | POA: Insufficient documentation

## 2016-06-13 DIAGNOSIS — S86002A Unspecified injury of left Achilles tendon, initial encounter: Secondary | ICD-10-CM | POA: Insufficient documentation

## 2016-06-13 DIAGNOSIS — Y939 Activity, unspecified: Secondary | ICD-10-CM | POA: Insufficient documentation

## 2016-06-13 MED ORDER — KETOROLAC TROMETHAMINE 15 MG/ML IJ SOLN
15.0000 mg | Freq: Once | INTRAMUSCULAR | Status: AC
  Administered 2016-06-13: 15 mg via INTRAMUSCULAR
  Filled 2016-06-13: qty 1

## 2016-06-13 MED ORDER — HYDROMORPHONE HCL 1 MG/ML IJ SOLN
1.0000 mg | Freq: Once | INTRAMUSCULAR | Status: AC
  Administered 2016-06-13: 1 mg via INTRAMUSCULAR
  Filled 2016-06-13: qty 1

## 2016-06-13 MED ORDER — OXYCODONE-ACETAMINOPHEN 5-325 MG PO TABS: 1.0000 | ORAL_TABLET | Freq: Four times a day (QID) | ORAL | 0 refills | Status: DC | PRN

## 2016-06-13 MED ORDER — IBUPROFEN 600 MG PO TABS: 600.0000 mg | ORAL_TABLET | Freq: Four times a day (QID) | ORAL | 0 refills | Status: DC | PRN

## 2016-06-13 NOTE — ED Provider Notes (Signed)
[facility] Provider Note   CSN: [id number] Arrival date & time: 06/13/16  1149  By signing my name below, I, Sonum Patel, attest that this documentation has been prepared under the direction and in the presence of Raeford Razor, MD. Electronically Signed: Sonum Patel, Neurosurgeon. 06/13/16. 1:29 PM.  History   Chief Complaint Chief Complaint  Patient presents with  . Ankle Pain    The history is provided by the patient. No language interpreter was used.     HPI Comments: Alvin Anderson is a 36 y.o. male who presents to the Emergency Department complaining of left sided calf and ankle pain with associated swelling that began 4 days ago. Patient states he turned to walk when he felt a sudden pop in his left ankle. He reports constant, worsening pain since the injury with pain that is aggravated with bearing weight and ambulation. He denies numbness.   Past Medical History:  Diagnosis Date  . Dislocated knee     There are no active problems to display for this patient.   Past Surgical History:  Procedure Laterality Date  . APPENDECTOMY    . HERNIA REPAIR         Home Medications    Prior to Admission medications   Medication Sig Start Date End Date Taking? Authorizing Provider  cephALEXin (KEFLEX) 500 MG capsule Take 1 capsule (500 mg total) by mouth 4 (four) times daily. 05/17/15   Mady Gemma, PA-C  clindamycin (CLEOCIN) 150 MG capsule Take 2 capsules (300 mg total) by mouth 3 (three) times daily. 05/05/16   Hope Orlene Och, NP  HYDROcodone-acetaminophen (NORCO/VICODIN) 5-325 MG tablet Take 1 tablet by mouth every 4 (four) hours as needed. 05/17/15   Mady Gemma, PA-C  ibuprofen (ADVIL,MOTRIN) 800 MG tablet Take 1 tablet (800 mg total) by mouth 3 (three) times daily. 06/11/15   [name], PA-C  naproxen (NAPROSYN) 500 MG tablet Take 1 tablet (500 mg total) by mouth 2 (two) times daily. 05/05/16   Hope Orlene Och, NP  traMADol (ULTRAM) 50 MG tablet  Take 1 tablet (50 mg total) by mouth every 6 (six) hours as needed. 05/05/16   Hope Orlene Och, NP    Family History No family history on file.  Social History Social History  Substance Use Topics  . Smoking status: Current Every Day Smoker    Packs/day: 0.50  . Smokeless tobacco: Never Used  . Alcohol use No     Allergies   Amoxicillin   Review of Systems Review of Systems  Musculoskeletal: Positive for arthralgias, joint swelling and myalgias.  Neurological: Negative for numbness.     Physical Exam Updated Vital Signs Ht 6' (1.829 m)   Wt 190 lb (86.2 kg)   BMI 25.77 kg/m   Physical Exam  Constitutional: He is oriented to person, place, and time. He appears well-developed and well-nourished.  HENT:  Head: Normocephalic.  Eyes: EOM are normal.  Neck: Normal range of motion.  Pulmonary/Chest: Effort normal.  Abdominal: He exhibits no distension.  Musculoskeletal: Normal range of motion. He exhibits tenderness.  LLE: Tenderness to left posterior lower leg from calf down into heel. Mild swelling from calf down to foot. There is some plantar flexion noted with Simmods-Thompson test. NVI  Neurological: He is alert and oriented to person, place, and time.  Psychiatric: He has a normal mood and affect.  Nursing note and vitals reviewed.    ED Treatments / Results   COORDINATION OF CARE: 1:20  PM Discussed treatment plan with pt at bedside and pt agreed to plan.   Labs (all labs ordered are listed, but only abnormal results are displayed) Labs Reviewed - No data to display  EKG  EKG Interpretation None       Radiology No results found.  Procedures Procedures (including critical care time)  Medications Ordered in ED Medications - No data to display   Initial Impression / Assessment and Plan / ED Course  I have reviewed the triage vital signs and the nursing notes.  Pertinent labs & imaging results that were available during my care of the patient were  reviewed by me and considered in my medical decision making (see chart for details).    35yM with L ankle/calf pain. I suspect he may have a partial achilles tear. There is some movement with Simmonds-Thompson test. Crutches. Splint. PRN pain meds. Outpt ortho or podiatry FU. Work note.   Final Clinical Impressions(s) / ED Diagnoses   Final diagnoses:  Injury of left Achilles tendon, initial encounter    New Prescriptions New Prescriptions   No medications on file   I personally preformed the services scribed in my presence. The recorded information has been reviewed is accurate. Raeford Razor, MD.    Raeford Razor, MD 06/16/16 [phone]

## 2016-06-13 NOTE — ED Notes (Signed)
Contacted ortho for splint placement

## 2016-06-13 NOTE — ED Notes (Signed)
Pt wheeled to vehicle.  Verbalized understanding of discharge instructions.  

## 2016-06-13 NOTE — ED Triage Notes (Signed)
Reports that on Thursday he twisted his foot while walking. States he "felt his left achilles pop." Reports he is unable to bear full weight on this extremity.

## 2016-06-13 NOTE — ED Notes (Signed)
Ortho tech at bedside 

## 2016-06-29 ENCOUNTER — Encounter (HOSPITAL_COMMUNITY): Payer: Self-pay | Admitting: Emergency Medicine

## 2016-06-29 ENCOUNTER — Emergency Department (HOSPITAL_COMMUNITY)
Admission: EM | Admit: 2016-06-29 | Discharge: 2016-06-29 | Disposition: A | Discharge status: Home / Self Care | Payer: Self-pay | Attending: Emergency Medicine | Admitting: Emergency Medicine

## 2016-06-29 ENCOUNTER — Encounter: Payer: Self-pay | Admitting: Podiatry

## 2016-06-29 DIAGNOSIS — S86002D Unspecified injury of left Achilles tendon, subsequent encounter: Secondary | ICD-10-CM | POA: Insufficient documentation

## 2016-06-29 DIAGNOSIS — X58XXXD Exposure to other specified factors, subsequent encounter: Secondary | ICD-10-CM | POA: Insufficient documentation

## 2016-06-29 DIAGNOSIS — Z4789 Encounter for other orthopedic aftercare: Secondary | ICD-10-CM | POA: Insufficient documentation

## 2016-06-29 DIAGNOSIS — F17200 Nicotine dependence, unspecified, uncomplicated: Secondary | ICD-10-CM | POA: Insufficient documentation

## 2016-06-29 MED ORDER — MELOXICAM 7.5 MG PO TABS: 7.5000 mg | ORAL_TABLET | Freq: Every day | ORAL | 0 refills | Status: DC | PRN

## 2016-06-29 MED ORDER — METHOCARBAMOL 500 MG PO TABS: 500.0000 mg | ORAL_TABLET | Freq: Four times a day (QID) | ORAL | 0 refills | Status: DC | PRN

## 2016-06-29 NOTE — ED Triage Notes (Addendum)
Patient c/o left foot pain. Reports having foot splinted here on 4/22 ago after dx of achilles tendonitis. Reports he is supposed to follow up with ortho on Friday. Cap refill < 3 seconds on left foot. Reports splint is wet "after showering today."

## 2016-06-29 NOTE — ED Provider Notes (Signed)
[facility]-EMERGENCY DEPT Provider Note   CSN: [id number] Arrival date & time: 06/29/16  1526  By signing my name below, I, Rosario Adie, attest that this documentation has been prepared under the direction and in the presence of [facility], PA-C.  Electronically Signed: Rosario Adie, ED Scribe. 06/29/16. 4:13 PM.  History   Chief Complaint Chief Complaint  Patient presents with  . Foot Pain   The history is provided by the patient. No language interpreter was used.    HPI Comments: Alvin Anderson is a 36 y.o. male with no pertinent PMHx, who presents to the Emergency Department complaining of peristent ongoing left-sided leg pain beginning two weeks ago. Pt has been seen two times over the past month for this issue, most recently on 06/13/16. He was suspected to have partial achilles tendon rupture at that time and splinted/referred for f/u w/ a Podiatrist. He is scheduled for f/u w/ Dr Logan Bores in two days. Today he reports that a portion of his splint got wet, causing his sensation of tightness to acutely worsen. No noted treatments for his symptoms were tried prior to coming into the ED. He denies fever, chills, chest pain, shortness of breath, leg swelling, or any other associated symptoms.   Past Medical History:  Diagnosis Date  . Dislocated knee    There are no active problems to display for this patient.  Past Surgical History:  Procedure Laterality Date  . APPENDECTOMY    . HERNIA REPAIR      Home Medications    Prior to Admission medications   Medication Sig Start Date End Date Taking? Authorizing Provider  cephALEXin (KEFLEX) 500 MG capsule Take 1 capsule (500 mg total) by mouth 4 (four) times daily. 05/17/15   Mady Gemma, PA-C  clindamycin (CLEOCIN) 150 MG capsule Take 2 capsules (300 mg total) by mouth 3 (three) times daily. 05/05/16   Janne Napoleon, NP  meloxicam (MOBIC) 7.5 MG tablet Take 1 tablet (7.5 mg total) by mouth daily as needed for pain.  06/29/16   Trixie Dredge, PA-C  methocarbamol (ROBAXIN) 500 MG tablet Take 1-2 tablets (500-1,000 mg total) by mouth every 6 (six) hours as needed for muscle spasms (and pain). 06/29/16   Trixie Dredge, PA-C   Family History History reviewed. No pertinent family history.  Social History Social History  Substance Use Topics  . Smoking status: Current Every Day Smoker    Packs/day: 0.50  . Smokeless tobacco: Never Used  . Alcohol use No   Allergies   Amoxicillin  Review of Systems Review of Systems  Constitutional: Negative for chills and fever.  Respiratory: Negative for shortness of breath.   Cardiovascular: Negative for chest pain and leg swelling.  Musculoskeletal: Positive for arthralgias and myalgias.  Skin: Negative for color change.  Neurological: Negative for weakness and numbness.  Hematological: Does not bruise/bleed easily.  Psychiatric/Behavioral: Negative for self-injury.   Physical Exam Updated Vital Signs BP (!) 121/91 (BP Location: Right Arm)   Pulse 75   Temp 97.5 F (36.4 C) (Oral)   Resp 15   Ht 6' (1.829 m)   Wt 86.2 kg   SpO2 99%   BMI 25.77 kg/m   Physical Exam  Constitutional: He appears well-developed and well-nourished. No distress.  HENT:  Head: Normocephalic and atraumatic.  Neck: Neck supple.  Cardiovascular: Intact distal pulses.   Pulmonary/Chest: Effort normal.  Musculoskeletal: He exhibits tenderness.  Left lower leg with splint and ace wrap in place, which  is wet and dirty. With splint removed, calf is soft. Compartment soft. No erythema. Achille's is tender. No break in skin. Distal pulses and sensation intact.   Neurological: He is alert.  Skin: He is not diaphoretic.  Nursing note and vitals reviewed.  ED Treatments / Results  DIAGNOSTIC STUDIES: Oxygen Saturation is 99% on RA, normal by my interpretation.   COORDINATION OF CARE: 4:07 PM-Discussed next steps with pt. Pt verbalized understanding and is agreeable with the plan.    Labs (all labs ordered are listed, but only abnormal results are displayed) Labs Reviewed - No data to display  EKG  EKG Interpretation None      Radiology No results found.  Procedures Procedures   Medications Ordered in ED Medications - No data to display  Initial Impression / Assessment and Plan / ED Course  I have reviewed the triage vital signs and the nursing notes.  Pertinent labs & imaging results that were available during my care of the patient were reviewed by me and considered in my medical decision making (see chart for details).     Afebrile, nontoxic patient with discomfort with splint - splint also had become wet while pt was showering.  Under there is no significant skin breakdown, no e/o DVT.   Short leg posterior splint replaced by ortho tech. D/C home with posterior leg splint, podiatry follow up in 2 days as previously planned, symptomatic medication.  Discussed result, findings, treatment, and follow up  with patient.  Pt given return precautions.  Pt verbalizes understanding and agrees with plan.       Final Clinical Impressions(s) / ED Diagnoses   Final diagnoses:  Aftercare for cast or splint check or change  Achilles tendon injury, left, subsequent encounter   New Prescriptions Discharge Medication List as of 06/29/2016  4:38 PM    START taking these medications   Details  meloxicam (MOBIC) 7.5 MG tablet Take 1 tablet (7.5 mg total) by mouth daily as needed for pain., Starting Wed 06/29/2016, Print    methocarbamol (ROBAXIN) 500 MG tablet Take 1-2 tablets (500-1,000 mg total) by mouth every 6 (six) hours as needed for muscle spasms (and pain)., Starting Wed 06/29/2016, Print       I personally performed the services described in this documentation, which was scribed in my presence. The recorded information has been reviewed and is accurate.     Trixie DredgeWest, Cleophas Yoak, PA-C 06/29/16 1730    Mancel BaleWentz, Elliott, MD 06/30/16 [phone]

## 2016-06-29 NOTE — Discharge Instructions (Signed)
Read the information below.  Use the prescribed medication as directed.  Please discuss all new medications with your pharmacist.  You may return to the Emergency Department at any time for worsening condition or any new symptoms that concern you.  If you develop uncontrolled pain, weakness or numbness of the extremity, severe discoloration of the skin, or you are unable to move your toes, return to the ER for a recheck.

## 2016-07-10 NOTE — Progress Notes (Signed)
This encounter was created in error - please disregard.

## 2016-07-25 ENCOUNTER — Ambulatory Visit (INDEPENDENT_AMBULATORY_CARE_PROVIDER_SITE_OTHER): Payer: 59 | Admitting: Podiatry

## 2016-07-25 ENCOUNTER — Encounter: Payer: Self-pay | Admitting: Podiatry

## 2016-07-25 ENCOUNTER — Ambulatory Visit (INDEPENDENT_AMBULATORY_CARE_PROVIDER_SITE_OTHER): Payer: 59

## 2016-07-25 DIAGNOSIS — S86012A Strain of left Achilles tendon, initial encounter: Secondary | ICD-10-CM

## 2016-07-25 DIAGNOSIS — S99922A Unspecified injury of left foot, initial encounter: Secondary | ICD-10-CM | POA: Diagnosis not present

## 2016-07-25 DIAGNOSIS — M79672 Pain in left foot: Secondary | ICD-10-CM

## 2016-07-25 DIAGNOSIS — M779 Enthesopathy, unspecified: Secondary | ICD-10-CM

## 2016-07-25 NOTE — Progress Notes (Signed)
   HPI: 36 year old male otherwise healthy presents to the office as a new patient for evaluation of pain and tenderness to the back of the left heel. Patient states that approximately 7 weeks ago he heard a pop in the back of his leg. Patient has had significant pain and tenderness over since. Patient was not able to follow up sooner due to insurance reasons. Patient states that putting pressure on his foot is significantly painful.   Physical Exam: General: The patient is alert and oriented x3 in no acute distress.  Dermatology: Skin is warm, dry and supple bilateral lower extremities. Negative for open lesions or macerations.  Vascular: Palpable pedal pulses bilaterally. No edema or erythema noted. Capillary refill within normal limits.  Neurological: Epicritic and protective threshold grossly intact bilaterally.   Musculoskeletal Exam: Pain on palpation to the insertion of the Achilles tendon left lower extremity. Pain on palpation also noted with forced plantar flexion.  Radiographic Exam:  Normal osseous mineralization. Joint spaces preserved. No fracture/dislocation/boney destruction.  There does appear to be some obstruction of the Kager's triangle on lateral view, suggestive of Achilles tendon disruption.  Assessment: 1. Possible Achilles tendon rupture left lower extremity   Plan of Care:  1. Patient was evaluated. X-rays reviewed 2. Today and immobilization cam boot was dispensed for the patient with a heel lift. 3. Today were going to order an MRI left ankle to rule out Achilles tendon rupture left lower extremity 4. Return to clinic in 3 weeks to review MRI results and discuss possible surgical intervention if warranted   Felecia ShellingBrent M. Lucee Brissett, DPM [facility]  Dr. Felecia ShellingBrent M. Naftoli Penny, DPM    [address]                                        [place], [place] [address]                Office [phone]) [phone]  Fax [phone]

## 2016-07-26 ENCOUNTER — Telehealth: Payer: Self-pay | Admitting: *Deleted

## 2016-07-26 DIAGNOSIS — S86012A Strain of left Achilles tendon, initial encounter: Secondary | ICD-10-CM

## 2016-07-26 DIAGNOSIS — M779 Enthesopathy, unspecified: Secondary | ICD-10-CM

## 2016-07-26 NOTE — Telephone Encounter (Addendum)
-----   Message from Felecia ShellingBrent M Evans, DPM sent at 07/25/2016  7:35 PM EDT ----- Regarding: MRI left ankle Please order MRI w/ or w/out contrast left ankle.   Dx: Achilles tendon rupture left  Note dictated. Thanks, Dr. Logan BoresEvans. 07/26/2016-Faxed to [facility], gave orders to [facility] for pre-cert.08/10/2016-Pt called for the status of the MRI. I told him the pre-cert was in the process, and once received he would [facility].

## 2016-08-11 ENCOUNTER — Telehealth: Payer: Self-pay | Admitting: *Deleted

## 2016-08-11 NOTE — Telephone Encounter (Signed)
I left patient a message to call [facility] to [facility].  Authorization is not needed.  I informed him they had attempted to call him to schedule an appointment.

## 2016-08-19 ENCOUNTER — Other Ambulatory Visit: Payer: Self-pay

## 2016-09-18 ENCOUNTER — Ambulatory Visit
Admission: RE | Admit: 2016-09-18 | Discharge: 2016-09-18 | Disposition: A | Discharge status: Home / Self Care | Payer: Self-pay | Source: Ambulatory Visit | Attending: Podiatry | Admitting: Podiatry

## 2016-09-22 ENCOUNTER — Telehealth: Payer: Self-pay

## 2016-09-22 NOTE — Telephone Encounter (Signed)
Patient called requesting MRI results, I informed him that the schedulers will be calling him to set up an appt to go over MRI results with Dr Logan BoresEvans.

## 2016-09-23 ENCOUNTER — Telehealth: Payer: Self-pay | Admitting: Podiatry

## 2016-09-23 NOTE — Telephone Encounter (Signed)
Left vm for pt to call office to schedule appt. For MRI results. °

## 2016-09-23 NOTE — Telephone Encounter (Signed)
Just tell him regarding his heel pain, MRI did not recognize any significant findings. There are a few unrelated incidental findings unrelated to his heel pain.  Tha'ts what you can tell him. Thanks, Dr. Logan BoresEvans

## 2016-09-23 NOTE — Telephone Encounter (Signed)
LVM for patient to return call. 

## 2016-09-26 ENCOUNTER — Ambulatory Visit (INDEPENDENT_AMBULATORY_CARE_PROVIDER_SITE_OTHER): Payer: 59 | Admitting: Podiatry

## 2016-09-26 ENCOUNTER — Encounter: Payer: Self-pay | Admitting: Podiatry

## 2016-09-26 DIAGNOSIS — M722 Plantar fascial fibromatosis: Secondary | ICD-10-CM

## 2016-09-26 MED ORDER — BETAMETHASONE SOD PHOS & ACET 6 (3-3) MG/ML IJ SUSP: 3.0000 mg | Freq: Once | INTRAMUSCULAR | Status: DC

## 2016-09-26 NOTE — Progress Notes (Signed)
   Subjective: Patient presents today for follow-up treatment and evaluation regarding left lower extremity trauma. He was last seen 07/25/2016 at which time MRI was ordered regarding a popping in the back of his leg. Patient presents today for follow-up treatment and evaluation. Patient states that over the past 2 months she's been wearing an immobilization cam boot and is feeling much better. He states that he still has some residual pain on the plantar aspect of the left heel.  Objective: Physical Exam General: The patient is alert and oriented x3 in no acute distress.  Dermatology: Skin is warm, dry and supple bilateral lower extremities. Negative for open lesions or macerations bilateral.   Vascular: Dorsalis Pedis and Posterior Tibial pulses palpable bilateral.  Capillary fill time is immediate to all digits.  Neurological: Epicritic and protective threshold intact bilateral.   Musculoskeletal: Tenderness to palpation at the medial calcaneal tubercale and through the insertion of the plantar fascia of the left foot. All other joints range of motion within normal limits bilateral. Strength 5/5 in all groups bilateral.   Assessment: 1. Plantar fasciitis left foot  Plan of Care:  1. Patient evaluated. MRI reviewed today which was negative for Achilles tendon pathology.   2. Injection of 0.5cc Celestone soluspan injected into the left plantar fascia.  3. Plantar fascial band(s) dispensed 4. Recommended the patient begin to transition out of the immobilization cam boot  5. Instructed patient regarding therapies and modalities at home to alleviate symptoms.  6. Return to clinic in 4 weeks.     Felecia ShellingBrent M. Savana Anderson, DPM [facility]  Dr. Felecia ShellingBrent M. Eura Anderson, DPM    [address]                                     [place], [place] [address]                Office [phone]) [phone]  Fax [phone]

## 2016-10-26 ENCOUNTER — Ambulatory Visit: Payer: 59 | Admitting: Podiatry

## 2016-11-23 ENCOUNTER — Emergency Department (HOSPITAL_COMMUNITY): Admission: EM | Admit: 2016-11-23 | Discharge: 2016-11-23 | Discharge status: Against Medical Advice | Payer: Self-pay

## 2016-11-23 ENCOUNTER — Emergency Department (HOSPITAL_COMMUNITY)
Admission: EM | Admit: 2016-11-23 | Discharge: 2016-11-23 | Disposition: A | Discharge status: Home / Self Care | Payer: Self-pay | Attending: Emergency Medicine | Admitting: Emergency Medicine

## 2016-11-23 ENCOUNTER — Emergency Department (HOSPITAL_COMMUNITY): Admission: EM | Admit: 2016-11-23 | Discharge: 2016-11-23 | Discharge status: Absent without leave | Payer: Self-pay

## 2016-11-23 ENCOUNTER — Encounter (HOSPITAL_COMMUNITY): Payer: Self-pay | Admitting: *Deleted

## 2016-11-23 DIAGNOSIS — K0889 Other specified disorders of teeth and supporting structures: Secondary | ICD-10-CM | POA: Insufficient documentation

## 2016-11-23 DIAGNOSIS — F17200 Nicotine dependence, unspecified, uncomplicated: Secondary | ICD-10-CM | POA: Insufficient documentation

## 2016-11-23 DIAGNOSIS — Z79899 Other long term (current) drug therapy: Secondary | ICD-10-CM | POA: Insufficient documentation

## 2016-11-23 MED ORDER — KETOROLAC TROMETHAMINE 30 MG/ML IJ SOLN
30.0000 mg | Freq: Once | INTRAMUSCULAR | Status: AC
  Administered 2016-11-23: 30 mg via INTRAMUSCULAR
  Filled 2016-11-23: qty 1

## 2016-11-23 MED ORDER — CLINDAMYCIN HCL 150 MG PO CAPS: 300.0000 mg | ORAL_CAPSULE | Freq: Three times a day (TID) | ORAL | 0 refills | Status: AC

## 2016-11-23 MED ORDER — HYDROCODONE-ACETAMINOPHEN 5-325 MG PO TABS: 1.0000 | ORAL_TABLET | Freq: Four times a day (QID) | ORAL | 0 refills | Status: DC | PRN

## 2016-11-23 MED ORDER — OXYCODONE-ACETAMINOPHEN 5-325 MG PO TABS
1.0000 | ORAL_TABLET | Freq: Once | ORAL | Status: AC
  Administered 2016-11-23: 1 via ORAL
  Filled 2016-11-23: qty 1

## 2016-11-23 NOTE — ED Triage Notes (Signed)
Patient is alert and oriented x4.  Patient is being seen for right upper jaw dental pain that started 3 days ago.  Currently he rates his pain 10 of 10.

## 2016-11-23 NOTE — ED Notes (Signed)
Pt was yelling at NT in triage, this RN went in to speak with pt, he continues to yell and scream at staff. Asked pt to please calm down and speak to me nicely about what is going on. Pt is cursing. Security and [employer] at bedside, pt escorted out

## 2016-11-23 NOTE — ED Provider Notes (Signed)
[facility]-EMERGENCY DEPT Provider Note   CSN: [id number] Arrival date & time: 11/23/16  0711     History   Chief Complaint Chief Complaint  Patient presents with  . Dental Pain    HPI Alvin Anderson is a 36 y.o. male.  HPI Patient presents with dental pain in his left upper jaw for the last 3 days. Pain is severe. No trauma. No fevers. He is taken anti-inflammatories without relief. He states that the pain is severe. No nausea vomiting or diarrhea. No chills. No trauma. States it feels as if his tooth broke in half. Past Medical History:  Diagnosis Date  . Dislocated knee     There are no active problems to display for this patient.   Past Surgical History:  Procedure Laterality Date  . APPENDECTOMY    . HERNIA REPAIR         Home Medications    Prior to Admission medications   Medication Sig Start Date End Date Taking? Authorizing Provider  clindamycin (CLEOCIN) 150 MG capsule Take 2 capsules (300 mg total) by mouth 3 (three) times daily. 11/23/16 11/30/16  Benjiman Core, MD  HYDROcodone-acetaminophen (NORCO/VICODIN) 5-325 MG tablet Take 1-2 tablets by mouth every 6 (six) hours as needed. 11/23/16   Benjiman Core, MD  IBUPROFEN PO Take by mouth.    [provider]  meloxicam (MOBIC) 7.5 MG tablet Take 1 tablet (7.5 mg total) by mouth daily as needed for pain. 06/29/16   Trixie Dredge, PA-C  methocarbamol (ROBAXIN) 500 MG tablet Take 1-2 tablets (500-1,000 mg total) by mouth every 6 (six) hours as needed for muscle spasms (and pain). 06/29/16   Trixie Dredge, PA-C    Family History No family history on file.  Social History Social History  Substance Use Topics  . Smoking status: Current Every Day Smoker    Packs/day: 0.50  . Smokeless tobacco: Never Used  . Alcohol use No     Allergies   Amoxicillin   Review of Systems Review of Systems  Constitutional: Negative for appetite change.  HENT: Positive for dental problem.   Respiratory: Negative  for shortness of breath.   Cardiovascular: Negative for chest pain.  Gastrointestinal: Negative for abdominal pain.  Genitourinary: Negative for dysuria.  Musculoskeletal: Negative for back pain.  Neurological: Negative for numbness.  Psychiatric/Behavioral: Negative for confusion.     Physical Exam Updated Vital Signs BP (!) 200/146 (BP Location: Right Arm)   Pulse 78   Temp (!) 97.4 F (36.3 C) (Oral)   Resp (!) 22   Ht 6' (1.829 m)   Wt 86.2 kg (190 lb)   SpO2 100%   BMI 25.77 kg/m   Physical Exam  Constitutional: He appears well-developed.  HENT:  Head: Normocephalic.  Left upper most posterior jaw area tender. No tooth present. May have mild swelling. No tenderness of cheek.  Eyes: Pupils are equal, round, and reactive to light.  Pulmonary/Chest: Effort normal.  Abdominal: There is no tenderness.  Musculoskeletal: He exhibits no edema.  Neurological: He is alert.  Skin: Skin is warm.     ED Treatments / Results  Labs (all labs ordered are listed, but only abnormal results are displayed) Labs Reviewed - No data to display  EKG  EKG Interpretation None       Radiology No results found.  Procedures Procedures (including critical care time)  Medications Ordered in ED Medications  ketorolac (TORADOL) 30 MG/ML injection 30 mg (30 mg Intramuscular Given 11/23/16 0836)  oxyCODONE-acetaminophen (PERCOCET/ROXICET)  5-325 MG per tablet 1 tablet (1 tablet Oral Given 11/23/16 0836)     Initial Impression / Assessment and Plan / ED Course  I have reviewed the triage vital signs and the nursing notes.  Pertinent labs & imaging results that were available during my care of the patient were reviewed by me and considered in my medical decision making (see chart for details).     Patient dental pain. Tender over jaw may have some mild swelling. Note is present however. No fluctuance. Treat with Avelox and pain medicine and follow-up with dentistry.  Final  Clinical Impressions(s) / ED Diagnoses   Final diagnoses:  Pain, dental    New Prescriptions New Prescriptions   CLINDAMYCIN (CLEOCIN) 150 MG CAPSULE    Take 2 capsules (300 mg total) by mouth 3 (three) times daily.   HYDROCODONE-ACETAMINOPHEN (NORCO/VICODIN) 5-325 MG TABLET    Take 1-2 tablets by mouth every 6 (six) hours as needed.     Benjiman Core, MD 11/23/16 [phone]

## 2016-11-23 NOTE — ED Notes (Signed)
Pt refusing vital signs, screaming and cursing at staff demanding pain medication

## 2016-12-21 ENCOUNTER — Emergency Department (HOSPITAL_COMMUNITY)
Admission: EM | Admit: 2016-12-21 | Discharge: 2016-12-21 | Disposition: A | Discharge status: Home / Self Care | Payer: Self-pay | Attending: Emergency Medicine | Admitting: Emergency Medicine

## 2016-12-21 ENCOUNTER — Encounter (HOSPITAL_COMMUNITY): Payer: Self-pay

## 2016-12-21 DIAGNOSIS — K068 Other specified disorders of gingiva and edentulous alveolar ridge: Secondary | ICD-10-CM

## 2016-12-21 DIAGNOSIS — K0889 Other specified disorders of teeth and supporting structures: Secondary | ICD-10-CM | POA: Insufficient documentation

## 2016-12-21 DIAGNOSIS — F17200 Nicotine dependence, unspecified, uncomplicated: Secondary | ICD-10-CM | POA: Insufficient documentation

## 2016-12-21 MED ORDER — OXYCODONE-ACETAMINOPHEN 5-325 MG PO TABS
1.0000 | ORAL_TABLET | Freq: Once | ORAL | Status: AC
  Administered 2016-12-21: 1 via ORAL
  Filled 2016-12-21: qty 1

## 2016-12-21 NOTE — Discharge Instructions (Signed)
Please follow up with Dr. Mayford Knifeurner as soon as possible Bring you paperwork from the ED today

## 2016-12-21 NOTE — ED Provider Notes (Signed)
[facility]   CSN: [id number] Arrival date & time: 12/21/16  [id number]     History   Chief Complaint No chief complaint on file.   HPI Alvin Anderson [place]bill is a 36 y.o. male who presents with left upper gum pain. He states that the pain started two days ago. It is constant and severe. It is the same pain he had when he came to the ED on 10/3. He was given narcotics and antibiotics which helped the pain. He has not seen a dentist in the interim because he doesn't have the money. He denies fever, chills, inability to swallow, facial swelling. He's had the teeth pulled in that area over 5 years ago. No other dental pain.  HPI  Past Medical History:  Diagnosis Date  . Dislocated knee     There are no active problems to display for this patient.   Past Surgical History:  Procedure Laterality Date  . APPENDECTOMY    . HERNIA REPAIR         Home Medications    Prior to Admission medications   Medication Sig Start Date End Date Taking? Authorizing Provider  HYDROcodone-acetaminophen (NORCO/VICODIN) 5-325 MG tablet Take 1-2 tablets by mouth every 6 (six) hours as needed. 11/23/16   Benjiman CorePickering, Nathan, MD  IBUPROFEN PO Take by mouth.    [provider]  meloxicam (MOBIC) 7.5 MG tablet Take 1 tablet (7.5 mg total) by mouth daily as needed for pain. 06/29/16   Trixie DredgeWest, Emily, PA-C  methocarbamol (ROBAXIN) 500 MG tablet Take 1-2 tablets (500-1,000 mg total) by mouth every 6 (six) hours as needed for muscle spasms (and pain). 06/29/16   Trixie DredgeWest, Emily, PA-C    Family History No family history on file.  Social History Social History  Substance Use Topics  . Smoking status: Current Every Day Smoker    Packs/day: 0.50  . Smokeless tobacco: Never Used  . Alcohol use No     Allergies   Amoxicillin   Review of Systems Review of Systems  Constitutional: Negative for fever.  HENT: Negative for facial swelling and trouble  swallowing.        +gum pain  Respiratory: Negative for shortness of breath.      Physical Exam Updated Vital Signs BP (!) 170/86 (BP Location: Left Arm)   Pulse 76   Temp 98.3 F (36.8 C) (Oral)   Resp (!) 22   SpO2 99%   Physical Exam  Constitutional: He is oriented to person, place, and time. He appears well-developed and well-nourished. He appears distressed (pacing around room).  HENT:  Head: Normocephalic and atraumatic.  Mouth/Throat: No oral lesions. No trismus in the jaw. Dental caries present. No dental abscesses or uvula swelling.  No dentition over upper left posterior gums. Significant tenderness. No redness or drainage  Eyes: Pupils are equal, round, and reactive to light. Conjunctivae are normal. Right eye exhibits no discharge. Left eye exhibits no discharge. No scleral icterus.  Neck: Normal range of motion.  Cardiovascular: Normal rate.   Pulmonary/Chest: Effort normal. No respiratory distress.  Abdominal: He exhibits no distension.  Neurological: He is alert and oriented to person, place, and time.  Skin: Skin is warm and dry.  Psychiatric: He has a normal mood and affect. His behavior is normal.  Nursing note and vitals reviewed.    ED Treatments / Results  Labs (all labs ordered are listed, but only abnormal results are displayed) Labs Reviewed - No data  to display  EKG  EKG Interpretation None       Radiology No results found.  Procedures Procedures (including critical care time)  Medications Ordered in ED Medications  oxyCODONE-acetaminophen (PERCOCET/ROXICET) 5-325 MG per tablet 1 tablet (not administered)     Initial Impression / Assessment and Plan / ED Course  I have reviewed the triage vital signs and the nursing notes.  Pertinent labs & imaging results that were available during my care of the patient were reviewed by me and considered in my medical decision making (see chart for details).  36 year old with gumline pain.  Unclear etiology. Does not appear infected - possibly nerve related?  Patient is afebrile, non toxic appearing, and swallowing secretions well. I gave patient referral to dentist and stressed the importance of dental follow up for ultimate management of dental pain.   Final Clinical Impressions(s) / ED Diagnoses   Final diagnoses:  Pain in gums    New Prescriptions New Prescriptions   No medications on file     Beryle Quant 12/21/16 0840    Azalia Bilis, MD 12/21/16 [phone]

## 2016-12-21 NOTE — ED Triage Notes (Signed)
Patient complains of left upper gum pain x 2 days, states that the pain is in his gum, no teeth in left upper mouth.

## 2018-03-23 ENCOUNTER — Encounter (HOSPITAL_COMMUNITY): Payer: Self-pay | Admitting: Emergency Medicine

## 2018-03-23 ENCOUNTER — Emergency Department (HOSPITAL_COMMUNITY)
Admission: EM | Admit: 2018-03-23 | Discharge: 2018-03-23 | Disposition: A | Discharge status: Home / Self Care | Payer: Self-pay | Attending: Emergency Medicine | Admitting: Emergency Medicine

## 2018-03-23 DIAGNOSIS — L239 Allergic contact dermatitis, unspecified cause: Secondary | ICD-10-CM | POA: Insufficient documentation

## 2018-03-23 DIAGNOSIS — R21 Rash and other nonspecific skin eruption: Secondary | ICD-10-CM | POA: Insufficient documentation

## 2018-03-23 DIAGNOSIS — F17210 Nicotine dependence, cigarettes, uncomplicated: Secondary | ICD-10-CM | POA: Insufficient documentation

## 2018-03-23 MED ORDER — PREDNISONE 20 MG PO TABS
60.0000 mg | ORAL_TABLET | Freq: Once | ORAL | Status: AC
  Administered 2018-03-23: 60 mg via ORAL
  Filled 2018-03-23: qty 3

## 2018-03-23 MED ORDER — FAMOTIDINE 20 MG PO TABS
20.0000 mg | ORAL_TABLET | Freq: Once | ORAL | Status: AC
  Administered 2018-03-23: 20 mg via ORAL
  Filled 2018-03-23: qty 1

## 2018-03-23 MED ORDER — PREDNISONE 20 MG PO TABS: ORAL_TABLET | ORAL | 0 refills | Status: DC

## 2018-03-23 MED ORDER — DIPHENHYDRAMINE HCL 25 MG PO CAPS
25.0000 mg | ORAL_CAPSULE | Freq: Once | ORAL | Status: AC
  Administered 2018-03-23: 25 mg via ORAL
  Filled 2018-03-23: qty 1

## 2018-03-23 MED ORDER — FAMOTIDINE 20 MG PO TABS: 20.0000 mg | ORAL_TABLET | Freq: Two times a day (BID) | ORAL | 0 refills | Status: DC

## 2018-03-23 NOTE — ED Triage Notes (Signed)
  Patient comes in with rash on torso and face.  Patient states it has been going on for two days.  Patient has been using hydrocortisone cream to treat it at home.  No known changes in household items.  Patient A&O x4.

## 2018-03-23 NOTE — ED Provider Notes (Signed)
[facility] EMERGENCY DEPARTMENT Provider Note   CSN: [id number] Arrival date & time: 03/23/18  0220     History   Chief Complaint Chief Complaint  Patient presents with  . Rash    HPI Alvin Anderson is a 38 y.o. male.  The history is provided by the patient.  Rash  Location:  Torso and leg Torso rash location:  L flank, R flank and lower back Leg rash location:  L upper leg and R upper leg Quality: itchiness   Quality: not swelling and not weeping   Severity:  Moderate Onset quality:  Gradual Duration:  2 days Timing:  Constant Progression:  Unchanged Chronicity:  New Context: not eggs, not exposure to similar rash and not food   Relieved by:  Nothing Worsened by:  Nothing Ineffective treatments:  None tried Associated symptoms: no abdominal pain, no hoarse voice, no periorbital edema, no shortness of breath, no sore throat, no throat swelling, no tongue swelling and not wheezing   Spares exposed skin.  Spares palms and soles.  Denies new soap or detergent or pets.    Past Medical History:  Diagnosis Date  . Dislocated knee     There are no active problems to display for this patient.   Past Surgical History:  Procedure Laterality Date  . APPENDECTOMY    . HERNIA REPAIR          Home Medications    Prior to Admission medications   Medication Sig Start Date End Date Taking? Authorizing Provider  HYDROcodone-acetaminophen (NORCO/VICODIN) 5-325 MG tablet Take 1-2 tablets by mouth every 6 (six) hours as needed. 11/23/16   Benjiman Core, MD  IBUPROFEN PO Take by mouth.    [provider]  meloxicam (MOBIC) 7.5 MG tablet Take 1 tablet (7.5 mg total) by mouth daily as needed for pain. 06/29/16   Trixie Dredge, PA-C  methocarbamol (ROBAXIN) 500 MG tablet Take 1-2 tablets (500-1,000 mg total) by mouth every 6 (six) hours as needed for muscle spasms (and pain). 06/29/16   Trixie Dredge, PA-C    Family History History reviewed. No  pertinent family history.  Social History Social History   Tobacco Use  . Smoking status: Current Every Day Smoker    Packs/day: 0.50  . Smokeless tobacco: Never Used  Substance Use Topics  . Alcohol use: No  . Drug use: No     Allergies   Amoxicillin   Review of Systems Review of Systems  HENT: Negative for facial swelling, hoarse voice, sore throat, trouble swallowing and voice change.   Respiratory: Negative for shortness of breath, wheezing and stridor.   Gastrointestinal: Negative for abdominal pain.  Skin: Positive for rash.  All other systems reviewed and are negative.    Physical Exam Updated Vital Signs BP (!) 159/93 (BP Location: Right Arm)   Pulse 96   Temp 98.3 F (36.8 C) (Oral)   Resp 18   Ht 6' (1.829 m)   Wt 90.7 kg   SpO2 100%   BMI 27.12 kg/m   Physical Exam Vitals signs and nursing note reviewed.  Constitutional:      Appearance: Normal appearance.  HENT:     Head: Normocephalic and atraumatic.     Nose: Nose normal.     Comments: No swelling of the lips or tongue    Mouth/Throat:     Mouth: Mucous membranes are moist.     Pharynx: Oropharynx is clear.  Eyes:     Conjunctiva/sclera: Conjunctivae  normal.     Pupils: Pupils are equal, round, and reactive to light.  Neck:     Musculoskeletal: Normal range of motion and neck supple.  Cardiovascular:     Rate and Rhythm: Normal rate and regular rhythm.     Pulses: Normal pulses.     Heart sounds: Normal heart sounds.  Pulmonary:     Effort: Pulmonary effort is normal.     Breath sounds: Normal breath sounds. No stridor. No wheezing.  Abdominal:     General: Abdomen is flat. Bowel sounds are normal.     Tenderness: There is no abdominal tenderness.  Musculoskeletal: Normal range of motion.        General: No swelling.  Skin:    General: Skin is warm and dry.     Capillary Refill: Capillary refill takes less than 2 seconds.     Comments: Wheals on the lower back   Neurological:      General: No focal deficit present.     Mental Status: He is alert and oriented to person, place, and time.  Psychiatric:        Mood and Affect: Mood normal.        Behavior: Behavior normal.      ED Treatments / Results  Labs (all labs ordered are listed, but only abnormal results are displayed) Labs Reviewed - No data to display  EKG None  Radiology No results found.  Procedures Procedures (including critical care time)  Medications Ordered in ED Medications  predniSONE (DELTASONE) tablet 60 mg (60 mg Oral Given 03/23/18 0246)  famotidine (PEPCID) tablet 20 mg (20 mg Oral Given 03/23/18 0246)  diphenhydrAMINE (BENADRYL) capsule 25 mg (25 mg Oral Given 03/23/18 0246)       Final Clinical Impressions(s) / ED Diagnoses   Return for pain, intractable cough, productive cough,fevers >100.4 unrelieved by medication, shortness of breath, intractable vomiting, or diarrhea, abdominal pain, passing out,Inability to tolerate liquids or food, cough, altered mental status or any concerns. No signs of systemic illness or infection. The patient is nontoxic-appearing on exam and vital signs are within normal limits.   I have reviewed the triage vital signs and the nursing notes. Pertinent labs &imaging results that were available during my care of the patient were reviewed by me and considered in my medical decision making (see chart for details).  After history, exam, and medical workup I feel the patient has been appropriately medically screened and is safe for discharge home. Pertinent diagnoses were discussed with the patient. Patient was given return precautions.   Bhavik Cabiness, MD 03/23/18 [phone]

## 2018-05-03 ENCOUNTER — Emergency Department (HOSPITAL_COMMUNITY): Payer: Self-pay

## 2018-05-03 ENCOUNTER — Emergency Department (HOSPITAL_COMMUNITY)
Admission: EM | Admit: 2018-05-03 | Discharge: 2018-05-03 | Disposition: A | Discharge status: Home / Self Care | Payer: Self-pay | Attending: Emergency Medicine | Admitting: Emergency Medicine

## 2018-05-03 DIAGNOSIS — R52 Pain, unspecified: Secondary | ICD-10-CM

## 2018-05-03 DIAGNOSIS — M25562 Pain in left knee: Secondary | ICD-10-CM | POA: Insufficient documentation

## 2018-05-03 MED ORDER — NAPROXEN 500 MG PO TABS: 500.0000 mg | ORAL_TABLET | Freq: Two times a day (BID) | ORAL | 0 refills | Status: DC

## 2018-05-03 MED ORDER — NAPROXEN 250 MG PO TABS
500.0000 mg | ORAL_TABLET | Freq: Once | ORAL | Status: AC
  Administered 2018-05-03: 500 mg via ORAL
  Filled 2018-05-03: qty 2

## 2018-05-03 NOTE — ED Triage Notes (Signed)
See downtime paperwork.

## 2018-05-03 NOTE — Discharge Instructions (Addendum)
You were evaluated in the Emergency Department and after careful evaluation, we did not find any emergent condition requiring admission or further testing in the hospital.  Your x-ray today was normal, no broken bones.  It is likely that your bones are bruised.  Please use ice and the Ace wrap for the next 2 days, rest the knee, and use Tylenol or ibuprofen for pain.  Please return to the Emergency Department if you experience any worsening of your condition.  We encourage you to follow up with a primary care provider.  Thank you for allowing [place] to be a part of your care.

## 2018-05-03 NOTE — ED Provider Notes (Signed)
[facility] Emergency Department Provider Note MRN:  [id number]  Arrival date & time: 05/03/18     Chief Complaint   Knee pain History of Present Illness   Alvin Anderson is a 38 y.o. year-old male with no pertinent past medical history presenting to the ED with chief complaint of knee pain.  Shortly prior to arrival, patient was playing basketball, went up for a rebound at the same time as someone else, direct need to need trauma.  Trouble ambulating since the trauma.  No other injuries, no head trauma, loss consciousness.  Pain is moderate, worse with motion.  Review of Systems  A complete 10 system review of systems was obtained and all systems are negative except as noted in the HPI and PMH.   Patient's Health History   No past medical history on file.    No family history on file.  Social History   Socioeconomic History  . Marital status: Single    Spouse name: Not on file  . Number of children: Not on file  . Years of education: Not on file  . Highest education level: Not on file  Occupational History  . Not on file  Social Needs  . Financial resource strain: Not on file  . Food insecurity:    Worry: Not on file    Inability: Not on file  . Transportation needs:    Medical: Not on file    Non-medical: Not on file  Tobacco Use  . Smoking status: Not on file  Substance and Sexual Activity  . Alcohol use: Not on file  . Drug use: Not on file  . Sexual activity: Not on file  Lifestyle  . Physical activity:    Days per week: Not on file    Minutes per session: Not on file  . Stress: Not on file  Relationships  . Social connections:    Talks on phone: Not on file    Gets together: Not on file    Attends religious service: Not on file    Active member of club or organization: Not on file    Attends meetings of clubs or organizations: Not on file    Relationship status: Not on file  . Intimate partner violence:    Fear of current or ex  partner: Not on file    Emotionally abused: Not on file    Physically abused: Not on file    Forced sexual activity: Not on file  Other Topics Concern  . Not on file  Social History Narrative  . Not on file     Physical Exam  Vital Signs and Nursing Notes reviewed Vitals:   05/03/18 1124  BP: 127/84  Pulse: 73  Resp: 16  Temp: 98.1 F (36.7 C)  SpO2: 97%    CONSTITUTIONAL: Well-appearing, NAD NEURO:  Alert and oriented x 3, no focal deficits EYES:  eyes equal and reactive ENT/NECK:  no LAD, no JVD CARDIO: Regular rate, well-perfused, normal S1 and S2 PULM:  CTAB no wheezing or rhonchi GI/GU:  normal bowel sounds, non-distended, non-tender MSK/SPINE:  No gross deformities, mild edema to the left medial knee with decreased range of motion due to pain SKIN:  no rash, atraumatic PSYCH:  Appropriate speech and behavior  Diagnostic and Interventional Summary    EKG Interpretation  Date/Time:    Ventricular Rate:    PR Interval:    QRS Duration:   QT Interval:    QTC Calculation:   R Axis:  Text Interpretation:        Labs Reviewed - No data to display  DG Knee Complete 4 Views Left  Final Result      Medications  naproxen (NAPROSYN) tablet 500 mg (500 mg Oral Given 05/03/18 1129)     Procedures Critical Care  ED Course and Medical Decision Making  I have reviewed the triage vital signs and the nursing notes.  Pertinent labs & imaging results that were available during my care of the patient were reviewed by me and considered in my medical decision making (see below for details).  X-ray without fracture, mechanism and consistent with dislocation, no laxity, neurovascularly intact distally.  Favoring bone bruising, possible hemarthrosis given the gradual worsening of pain with time.  Advised rice, PCP follow-up.  After the discussed management above, the patient was determined to be safe for discharge.  The patient was in agreement with this plan and all  questions regarding their care were answered.  ED return precautions were discussed and the patient will return to the ED with any significant worsening of condition.  Elmer Sow. Pilar Plate, MD [facility] Emergency Medicine [facility] [email or web address]  Final Clinical Impressions(s) / ED Diagnoses     ICD-10-CM   1. Acute pain of left knee M25.562   2. Pain R52 DG Knee Complete 4 Views Left    DG Knee Complete 4 Views Left    ED Discharge Orders         Ordered    naproxen (NAPROSYN) 500 MG tablet  2 times daily     05/03/18 1315             Sabas Sous, MD 05/03/18 1319

## 2019-07-17 ENCOUNTER — Other Ambulatory Visit: Payer: Self-pay

## 2019-07-17 ENCOUNTER — Encounter (HOSPITAL_COMMUNITY): Payer: Self-pay

## 2019-07-17 ENCOUNTER — Emergency Department (HOSPITAL_COMMUNITY): Payer: Self-pay

## 2019-07-17 ENCOUNTER — Emergency Department (HOSPITAL_COMMUNITY)
Admission: EM | Admit: 2019-07-17 | Discharge: 2019-07-17 | Disposition: A | Discharge status: Home / Self Care | Payer: Self-pay | Attending: Emergency Medicine | Admitting: Emergency Medicine

## 2019-07-17 DIAGNOSIS — J029 Acute pharyngitis, unspecified: Secondary | ICD-10-CM | POA: Insufficient documentation

## 2019-07-17 DIAGNOSIS — F17210 Nicotine dependence, cigarettes, uncomplicated: Secondary | ICD-10-CM | POA: Insufficient documentation

## 2019-07-17 DIAGNOSIS — R221 Localized swelling, mass and lump, neck: Secondary | ICD-10-CM

## 2019-07-17 DIAGNOSIS — K1120 Sialoadenitis, unspecified: Secondary | ICD-10-CM | POA: Insufficient documentation

## 2019-07-17 LAB — COMPREHENSIVE METABOLIC PANEL
ALT: 20 U/L (ref 0–44)
AST: 23 U/L (ref 15–41)
Albumin: 4.2 g/dL (ref 3.5–5.0)
Alkaline Phosphatase: 65 U/L (ref 38–126)
Anion gap: 10 (ref 5–15)
BUN: 13 mg/dL (ref 6–20)
CO2: 27 mmol/L (ref 22–32)
Calcium: 8.6 mg/dL — ABNORMAL LOW (ref 8.9–10.3)
Chloride: 100 mmol/L (ref 98–111)
Creatinine, Ser: 0.95 mg/dL (ref 0.61–1.24)
GFR calc Af Amer: >60 mL/min (ref >60)
GFR calc non Af Amer: >60 mL/min (ref >60)
Glucose, Bld: 89 mg/dL (ref 70–99)
Potassium: 3.8 mmol/L (ref 3.5–5.1)
Sodium: 137 mmol/L (ref 135–145)
Total Bilirubin: 1.7 mg/dL — ABNORMAL HIGH (ref 0.3–1.2)
Total Protein: 7 g/dL (ref 6.5–8.1)

## 2019-07-17 LAB — CBC WITH DIFFERENTIAL/PLATELET
Abs Immature Granulocytes: 0.04 10*3/uL (ref 0.00–0.07)
Basophils Absolute: 0.1 10*3/uL (ref 0.0–0.1)
Basophils Relative: 0 %
Eosinophils Absolute: 0.1 10*3/uL (ref 0.0–0.5)
Eosinophils Relative: 1 %
HCT: 43.9 % (ref 39.0–52.0)
Hemoglobin: 14.4 g/dL (ref 13.0–17.0)
Immature Granulocytes: 0 %
Lymphocytes Relative: 13 %
Lymphs Abs: 1.9 10*3/uL (ref 0.7–4.0)
MCH: 28.7 pg (ref 26.0–34.0)
MCHC: 32.8 g/dL (ref 30.0–36.0)
MCV: 87.5 fL (ref 80.0–100.0)
Monocytes Absolute: 0.9 10*3/uL (ref 0.1–1.0)
Monocytes Relative: 6 %
Neutro Abs: 11.5 10*3/uL — ABNORMAL HIGH (ref 1.7–7.7)
Neutrophils Relative %: 80 %
Platelets: 191 10*3/uL (ref 150–400)
RBC: 5.02 MIL/uL (ref 4.22–5.81)
RDW: 14.5 % (ref 11.5–15.5)
WBC: 14.5 10*3/uL — ABNORMAL HIGH (ref 4.0–10.5)
nRBC: 0 % (ref 0.0–0.2)

## 2019-07-17 LAB — LACTIC ACID, PLASMA: Lactic Acid, Venous: 1.3 mmol/L (ref 0.5–1.9)

## 2019-07-17 MED ORDER — HYDROCODONE-ACETAMINOPHEN 5-325 MG PO TABS: 1.0000 | ORAL_TABLET | Freq: Four times a day (QID) | ORAL | 0 refills | Status: DC | PRN

## 2019-07-17 MED ORDER — LACTATED RINGERS IV BOLUS
2000.0000 mL | Freq: Once | INTRAVENOUS | Status: AC
  Administered 2019-07-17: 2000 mL via INTRAVENOUS

## 2019-07-17 MED ORDER — IOHEXOL 300 MG/ML  SOLN
75.0000 mL | Freq: Once | INTRAMUSCULAR | Status: AC | PRN
  Administered 2019-07-17: 75 mL via INTRAVENOUS

## 2019-07-17 MED ORDER — CLINDAMYCIN HCL 150 MG PO CAPS: 450.0000 mg | ORAL_CAPSULE | Freq: Three times a day (TID) | ORAL | 0 refills | Status: AC

## 2019-07-17 MED ORDER — DEXAMETHASONE SODIUM PHOSPHATE 10 MG/ML IJ SOLN
10.0000 mg | Freq: Once | INTRAMUSCULAR | Status: AC
  Administered 2019-07-17: 10 mg via INTRAVENOUS
  Filled 2019-07-17: qty 1

## 2019-07-17 MED ORDER — CLINDAMYCIN PHOSPHATE 600 MG/50ML IV SOLN
600.0000 mg | Freq: Once | INTRAVENOUS | Status: AC
  Administered 2019-07-17: 600 mg via INTRAVENOUS
  Filled 2019-07-17: qty 50

## 2019-07-17 MED ORDER — MORPHINE SULFATE (PF) 4 MG/ML IV SOLN
4.0000 mg | Freq: Once | INTRAVENOUS | Status: AC
  Administered 2019-07-17: 4 mg via INTRAVENOUS
  Filled 2019-07-17: qty 1

## 2019-07-17 MED ORDER — SODIUM CHLORIDE (PF) 0.9 % IJ SOLN
INTRAMUSCULAR | Status: AC
  Administered 2019-07-17: 1 mL
  Filled 2019-07-17: qty 50

## 2019-07-17 NOTE — ED Provider Notes (Signed)
[facility]-EMERGENCY DEPT Provider Note   CSN: [id number] Arrival date & time: 07/17/19  1850     History Chief Complaint  Patient presents with  . Allergic Reaction    Alvin Anderson is a 39 y.o. male with no pertinent past medical history who presents today for evaluation of right-sided neck swelling.  He states that this started last night after he ate at [employer].  He states it has been worsening.  His significant other reports that he is having difficulty talking due to pain and sore throat.  He states that his neck swelling has been gradually worsening.  He reports significant pain.  He denies any fevers.  No history of prior.  He denies any rashes.  No nausea vomiting or diarrhea.  No recent trauma or injury.  He reports that he has generally bad teeth and is recently seen a dentist.  He denies drooling.  His symptoms are made worse with speaking, touch, and movement.  No alleviating symptoms noted.  He reports worsening pain and swelling.  HPI     Past Medical History:  Diagnosis Date  . Dislocated knee     There are no problems to display for this patient.   Past Surgical History:  Procedure Laterality Date  . APPENDECTOMY    . HERNIA REPAIR         History reviewed. No pertinent family history.  Social History   Tobacco Use  . Smoking status: Current Every Day Smoker    Packs/day: 0.50    Types: Cigarettes  . Smokeless tobacco: Never Used  Substance Use Topics  . Alcohol use: No  . Drug use: No    Home Medications Prior to Admission medications   Medication Sig Start Date End Date Taking? Authorizing Provider  naproxen sodium (ALEVE) 220 MG tablet Take 220 mg by mouth daily as needed (for pain).   Yes [provider]  clindamycin (CLEOCIN) 150 MG capsule Take 3 capsules (450 mg total) by mouth 3 (three) times daily for 10 days. 07/17/19 07/27/19  Cristina Gong, PA-C  famotidine (PEPCID) 20 MG tablet Take 1 tablet  (20 mg total) by mouth 2 (two) times daily. Patient not taking: Reported on 07/17/2019 03/23/18   Palumbo, April, MD  HYDROcodone-acetaminophen (NORCO/VICODIN) 5-325 MG tablet Take 1 tablet by mouth every 6 (six) hours as needed for severe pain. 07/17/19   Cristina Gong, PA-C  meloxicam (MOBIC) 7.5 MG tablet Take 1 tablet (7.5 mg total) by mouth daily as needed for pain. Patient not taking: Reported on 07/17/2019 06/29/16   Trixie Dredge, PA-C  methocarbamol (ROBAXIN) 500 MG tablet Take 1-2 tablets (500-1,000 mg total) by mouth every 6 (six) hours as needed for muscle spasms (and pain). Patient not taking: Reported on 07/17/2019 06/29/16   Trixie Dredge, PA-C  naproxen (NAPROSYN) 500 MG tablet Take 1 tablet (500 mg total) by mouth 2 (two) times daily. Patient not taking: Reported on 07/17/2019 05/03/18   Sabas Sous, MD  predniSONE (DELTASONE) 20 MG tablet 3 tabs po day one, then 2 po daily x 4 days Patient not taking: Reported on 07/17/2019 03/23/18   Palumbo, April, MD    Allergies    Amoxicillin  Review of Systems   Review of Systems  Constitutional: Negative for chills and fever.  HENT: Positive for facial swelling, sore throat, trouble swallowing and voice change. Negative for congestion and drooling.   Eyes: Negative for visual disturbance.  Respiratory: Negative for shortness of  breath.   Cardiovascular: Negative for chest pain.  Gastrointestinal: Negative for abdominal pain, nausea and vomiting.  Musculoskeletal: Negative for back pain and neck pain.  Skin: Negative for color change, rash and wound.  Neurological: Negative for weakness and headaches.  Psychiatric/Behavioral: Negative for confusion.  All other systems reviewed and are negative.   Physical Exam Updated Vital Signs BP 117/77   Pulse 69   Temp 98.2 F (36.8 C) (Oral)   Resp (!) 21   Ht 6' (1.829 m)   Wt 86.2 kg   SpO2 99%   BMI 25.77 kg/m   Physical Exam Vitals and nursing note reviewed.  Constitutional:        General: He is not in acute distress.    Appearance: He is well-developed. He is not diaphoretic.  HENT:     Head: Normocephalic and atraumatic.     Nose: Nose normal.     Mouth/Throat:     Mouth: Mucous membranes are moist.     Comments: Wharton's duct, primarily on the right side, shows a firm, white area at the opening concerning for a sialolith.  There is obvious submandibular right-sided swelling.  No significant elevation of the floor the mouth.  There is trismus and muffled " hot potato" voice.  Dentition is generally poor with multiple teeth and broken and carious states.   Eyes:     General: No scleral icterus.       Right eye: No discharge.        Left eye: No discharge.     Conjunctiva/sclera: Conjunctivae normal.  Neck:     Comments: There is obvious right-sided submandibular swelling, please see HEENT Cardiovascular:     Rate and Rhythm: Normal rate and regular rhythm.     Pulses: Normal pulses.     Heart sounds: Normal heart sounds. No murmur.  Pulmonary:     Effort: Pulmonary effort is normal. No respiratory distress.     Breath sounds: Normal breath sounds. No stridor.  Abdominal:     General: There is no distension.     Palpations: Abdomen is soft.     Tenderness: There is no abdominal tenderness.  Musculoskeletal:        General: No deformity.     Cervical back: Normal range of motion and neck supple. No rigidity.  Lymphadenopathy:     Cervical: Cervical adenopathy present.  Skin:    General: Skin is warm and dry.  Neurological:     General: No focal deficit present.     Mental Status: He is alert.     Cranial Nerves: No cranial nerve deficit.     Motor: No abnormal muscle tone.  Psychiatric:        Mood and Affect: Mood normal.        Behavior: Behavior normal.           ED Results / Procedures / Treatments   Labs (all labs ordered are listed, but only abnormal results are displayed) Labs Reviewed  COMPREHENSIVE METABOLIC PANEL - Abnormal;  Notable for the following components:      Result Value   Calcium 8.6 (*)    Total Bilirubin 1.7 (*)    All other components within normal limits  CBC WITH DIFFERENTIAL/PLATELET - Abnormal; Notable for the following components:   WBC 14.5 (*)    Neutro Abs 11.5 (*)    All other components within normal limits  LACTIC ACID, PLASMA    EKG None  Radiology CT Soft Tissue  Neck W Contrast  Result Date: 07/17/2019 CLINICAL DATA:  Initial evaluation for acute right submandibular swelling. EXAM: CT NECK WITH CONTRAST TECHNIQUE: Multidetector CT imaging of the neck was performed using the standard protocol following the bolus administration of intravenous contrast. CONTRAST:  64mL OMNIPAQUE IOHEXOL 300 MG/ML  SOLN COMPARISON:  None. FINDINGS: Pharynx and larynx: Oral cavity within normal limits. Poor dentition with scattered dental caries and periapical lucencies noted about the teeth. No associated acute inflammatory changes. Oral cavity within normal limits. Palatine tonsils symmetric and normal. No tonsillar or peritonsillar abscess. Mild induration within the right parapharyngeal fat related to the acute inflammatory process within the right submandibular space. Remainder of the oropharynx and nasopharynx within normal limits. No retropharyngeal collection. Epiglottis normal. Vallecula clear. Remainder of the hypopharynx and supraglottic larynx within normal limits. True cords symmetric and normal. Subglottic airway clear. Salivary glands: Parotid and left submandibular glands are normal. There is asymmetric enlargement with irregularity and edematous changes involving the right submandibular gland, consistent with acute sialoadenitis. Associated swelling with inflammatory stranding seen within the adjacent right submandibular space, extending into the right parapharyngeal space and inferiorly along the right anterolateral neck. No obstructive stone seen in Wharton's duct. No discrete abscess or  drainable fluid collection. Thyroid: Normal. Lymph nodes: Prominent right level 1 B nodes measure up to 1 cm in short axis, presumably reactive. Mildly prominent 7 mm submental node noted as well. No other pathologically enlarged lymph nodes seen within the neck. Vascular: Normal intravascular enhancement seen throughout the neck. Limited intracranial: Unremarkable. Visualized orbits: Visualized globes and orbital soft tissues within normal limits. Mastoids and visualized paranasal sinuses: Visualized paranasal sinuses are largely clear. Mastoid air cells and middle ear cavities are well pneumatized and free of fluid. Skeleton: No acute osseous abnormality. No discrete or worrisome osseous lesions. Upper chest: Paraseptal emphysematous changes noted within the visualized lungs. Partially visualized lungs are otherwise clear. Visualized upper chest demonstrates no other acute finding. Other: None. IMPRESSION: 1. Findings consistent with acute right submandibular sialoadenitis. No obstructive stone seen within Wharton's duct. No discrete abscess or drainable fluid collection. 2. Prominent right level 1b and submental lymph nodes, presumably reactive. 3. Poor dentition with scattered dental caries and periapical lucencies about the teeth. No associated acute inflammatory changes. Electronically Signed   By: Rise Mu M.D.   On: 07/17/2019 21:06    Procedures Procedures (including critical care time)  Medications Ordered in ED Medications  lactated ringers bolus 2,000 mL (0 mLs Intravenous Stopped 07/17/19 2205)  dexamethasone (DECADRON) injection 10 mg (10 mg Intravenous Given 07/17/19 2040)  iohexol (OMNIPAQUE) 300 MG/ML solution 75 mL (75 mLs Intravenous Contrast Given 07/17/19 2047)  sodium chloride (PF) 0.9 % injection (1 mL  Given by Other 07/17/19 2234)  morphine 4 MG/ML injection 4 mg (4 mg Intravenous Given 07/17/19 2231)  clindamycin (CLEOCIN) IVPB 600 mg (0 mg Intravenous Stopped 07/17/19  2257)    ED Course  I have reviewed the triage vital signs and the nursing notes.  Pertinent labs & imaging results that were available during my care of the patient were reviewed by me and considered in my medical decision making (see chart for details).  Clinical Course as of Jul 17 57  Wed Jul 17, 2019  2218 [place] Treat for 10 days, drain for 3-5  Treat pain Antibiotics, massage, no ned to admit.    [EH]    Clinical Course User Index [EH] Norman Clay   MDM Rules/Calculators/A&P  Patient is a 39 year old man who presents today for evaluation of 24 hours of rapid progressing right-sided submandibular swelling.  Clinically exam is concerning for a submandibular salivary gland obstruction given there appears to be a stone at the opening of Wharton's duct.  On initial exam he does have a hot potato muffled voice and trismus.  He was treated with IV steroids, IV fluids after which he had significant improvement/resolution of his trismus and hot potato voice.  CT scan was obtained, while it does not show evidence of an obstructing stone clinically exam is concerning for an obstructive stone.  Labs are obtained, he does have a slight leukocytosis.  I spoke with Dr. Annalee Genta of ENT.  Given that patient had trismus with a muffled voice and the size of the swelling considered admission for continued observation and possible ENT intervention.  Dr. Annalee Genta recommends discharge home with antibiotics, instructions on sour candies and other sialagogues, to massage the gland, antibiotics for 10 days, treating his pain.  Given that patient appears to have improved after IV steroids and IV antibiotics this seems reasonable.  I did discuss possibility of admission with patient who wishes for discharge home.  Is able to p.o. challenge while in the emergency room without difficulty.  Return precautions were discussed with patient who states their understanding.  At the  time of discharge patient denied any unaddressed complaints or concerns.  Patient is agreeable for discharge home.  Note: Portions of this report may have been transcribed using voice recognition software. Every effort was made to ensure accuracy; however, inadvertent computerized transcription errors may be present  Final Clinical Impression(s) / ED Diagnoses Final diagnoses:  Sialoadenitis of submandibular gland  Neck swelling    Rx / DC Orders ED Discharge Orders         Ordered    clindamycin (CLEOCIN) 150 MG capsule  3 times daily     07/17/19 2331    HYDROcodone-acetaminophen (NORCO/VICODIN) 5-325 MG tablet  Every 6 hours PRN     07/17/19 2331           Cristina Gong, PA-C 07/18/19 0059    Tegeler, Canary Brim, MD 07/18/19 0330

## 2019-07-17 NOTE — ED Triage Notes (Signed)
Patient states he began having throat swelling last night after eating [employer]. Patient's significant other states he is unable to talk and is unable to swallow water or his own saliva.

## 2019-07-17 NOTE — ED Notes (Signed)
Dark green, 2 gold, and blue sent down extra.

## 2019-07-17 NOTE — Discharge Instructions (Signed)
Please take Ibuprofen (Advil, motrin) and Tylenol (acetaminophen) to relieve your pain.  You may take up to 600 MG (3 pills) of normal strength ibuprofen every 8 hours as needed.  In between doses of ibuprofen you make take tylenol, up to 1,000 mg (two extra strength pills).  Do not take more than 3,000 mg tylenol in a 24 hour period.  Please check all medication labels as many medications such as pain and cold medications may contain tylenol.  Do not drink alcohol while taking these medications.  Do not take other NSAID'S while taking ibuprofen (such as aleve or naproxen).  Please take ibuprofen with food to decrease stomach upset.  You may have diarrhea from the antibiotics.  It is very important that you continue to take the antibiotics even if you get diarrhea unless a medical professional tells you that you may stop taking them.  If you stop too early the bacteria you are being treated for will become stronger and you may need different, more powerful antibiotics that have more side effects and worsening diarrhea.  Please stay well hydrated and consider probiotics as they may decrease the severity of your diarrhea.   Today you received medications that may make you sleepy or impair your ability to make decisions.  For the next 24 hours please do not drive, operate heavy machinery, care for a small child with out another adult present, or perform any activities that may cause harm to you or someone else if you were to fall asleep or be impaired.   You are being prescribed a medication which may make you sleepy. Please follow up of listed precautions for at least 24 hours after taking one dose.

## 2020-04-19 ENCOUNTER — Encounter (HOSPITAL_COMMUNITY): Payer: Self-pay | Admitting: Emergency Medicine

## 2020-04-19 ENCOUNTER — Emergency Department (HOSPITAL_COMMUNITY): Payer: Self-pay

## 2020-04-19 ENCOUNTER — Emergency Department (HOSPITAL_COMMUNITY)
Admission: EM | Admit: 2020-04-19 | Discharge: 2020-04-19 | Disposition: A | Discharge status: Home / Self Care | Payer: Self-pay | Attending: Emergency Medicine | Admitting: Emergency Medicine

## 2020-04-19 ENCOUNTER — Other Ambulatory Visit: Payer: Self-pay

## 2020-04-19 DIAGNOSIS — M25531 Pain in right wrist: Secondary | ICD-10-CM | POA: Insufficient documentation

## 2020-04-19 DIAGNOSIS — F17210 Nicotine dependence, cigarettes, uncomplicated: Secondary | ICD-10-CM | POA: Insufficient documentation

## 2020-04-19 MED ORDER — IBUPROFEN 600 MG PO TABS
600.0000 mg | ORAL_TABLET | Freq: Four times a day (QID) | ORAL | 0 refills | Status: DC | PRN
Start: 2020-04-19 — End: 2022-03-22

## 2020-04-19 MED ORDER — DICLOFENAC SODIUM 1 % EX GEL: 2.0000 g | Freq: Four times a day (QID) | CUTANEOUS | 2 refills | Status: DC

## 2020-04-19 MED ORDER — IBUPROFEN 200 MG PO TABS
600.0000 mg | ORAL_TABLET | Freq: Once | ORAL | Status: AC
  Administered 2020-04-19: 600 mg via ORAL
  Filled 2020-04-19: qty 3

## 2020-04-19 NOTE — Discharge Instructions (Addendum)
You have been seen today for wrist pain. There were no acute abnormalities on the x-rays, including no sign of fracture or dislocation, however, there could be injuries to the soft tissues, such as the ligaments or tendons that are not seen on xrays. There could also be what are called occult fractures that are small fractures not seen on xray. Antiinflammatory medications: Take 600 mg of ibuprofen every 6 hours or 440 mg (over the counter dose) to 500 mg (prescription dose) of naproxen every 12 hours for the next 3 days. After this time, these medications may be used as needed for pain. Take these medications with food to avoid upset stomach. Choose only one of these medications, do not take them together. Acetaminophen (generic for Tylenol): Should you continue to have additional pain while taking the ibuprofen or naproxen, you may add in acetaminophen as needed. Your daily total maximum amount of acetaminophen from all sources should be limited to 4000mg /day for persons without liver problems, or 2000mg /day for those with liver problems. Diclofenac gel: This is a topical anti-inflammatory medication and can be applied directly to the painful region.  Do not use on the face or genitals.  This medication may be used as an alternative to oral anti-inflammatory medications, such as ibuprofen or naproxen. Ice: May apply ice to the area over the next 24 hours for 15 minutes at a time to reduce swelling. Elevation: Keep the extremity elevated as often as possible to reduce pain and inflammation. Support: Wear the splint for support and comfort. Wear this until pain resolves.  Exercises: After the pain subsides, start by performing these exercises a few times a week, increasing the frequency until you are performing them twice daily.  Follow up: If symptoms are improving, you may follow up with your primary care provider for any continued management. If symptoms are not starting to improve within a week, you  should follow up with the orthopedic specialist within two weeks. Return: Return to the ED for numbness, weakness, increasing pain, overall worsening symptoms, loss of function, or if symptoms are not improving, you have tried to follow up with the orthopedic specialist, and have been unable to do so.  For prescription assistance, may try using prescription discount sites or apps, such as goodrx.com or Good Rx smart phone app.

## 2020-04-19 NOTE — Progress Notes (Signed)
Orthopedic Tech Progress Note Patient Details:  Alvin Anderson 05/15/1980 [id number]  Ortho Devices Type of Ortho Device: Velcro wrist splint Ortho Device/Splint Location: Right Wrist Ortho Device/Splint Interventions: Application,Adjustment   Post Interventions Patient Tolerated: Well Instructions Provided: Adjustment of device   Gawain Crombie E Rumaldo Difatta 04/19/2020, 2:56 PM

## 2020-04-19 NOTE — ED Notes (Signed)
An After Visit Summary was printed and given to the patient. Discharge instructions given and no further questions at this time.  

## 2020-04-19 NOTE — ED Triage Notes (Signed)
Patient here from home reporting right wrist pain.

## 2020-04-19 NOTE — ED Provider Notes (Signed)
[facility]-EMERGENCY DEPT Provider Note   CSN: [id number] Arrival date & time: 04/19/20  1256     History Chief Complaint  Patient presents with  . Wrist Pain    Alvin Anderson is a 40 y.o. male.  HPI     Alvin Anderson is a 40 y.o. male, patient with no pertinent past medical history, presenting to the ED with right wrist pain beginning yesterday. 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 The pain is described as a soreness and sharp to the ulnar side of the wrist, worse with movement, nonradiating. He has not tried any therapies for his complaint.  Denies trauma, numbness, weakness, or any other complaints.   Past Medical History:  Diagnosis Date  . Dislocated knee     There are no problems to display for this patient.   Past Surgical History:  Procedure Laterality Date  . APPENDECTOMY    . HERNIA REPAIR         No family history on file.  Social History   Tobacco Use  . Smoking status: Current Every Day Smoker    Packs/day: 0.50    Types: Cigarettes  . Smokeless tobacco: Never Used  Vaping Use  . Vaping Use: Never used  Substance Use Topics  . Alcohol use: No  . Drug use: No    Home Medications Prior to Admission medications   Medication Sig Start Date End Date Taking? Authorizing Provider  diclofenac Sodium (VOLTAREN) 1 % GEL Apply 2 g topically 4 (four) times daily. 04/19/20  Yes Dekendrick Uzelac C, PA-C  ibuprofen (ADVIL) 600 MG tablet Take 1 tablet (600 mg total) by mouth every 6 (six) hours as needed. 04/19/20  Yes Ralyn Stlaurent C, PA-C  famotidine (PEPCID) 20 MG tablet Take 1 tablet (20 mg total) by mouth 2 (two) times daily. Patient not taking: Reported on 07/17/2019 03/23/18   Palumbo, April, MD  naproxen sodium (ALEVE) 220 MG tablet Take 220 mg by mouth daily as needed (for pain).    [provider]    Allergies    Amoxicillin  Review of Systems   Review of  Systems  Musculoskeletal: Positive for arthralgias and joint swelling.  Neurological: Negative for weakness and numbness.    Physical Exam Updated Vital Signs BP (!) 123/95 (BP Location: Left Arm)   Pulse 71   Temp 97.9 F (36.6 C) (Oral)   Resp 17   SpO2 100%   Physical Exam Vitals and nursing note reviewed.  Constitutional:      General: He is not in acute distress.    Appearance: He is well-developed and well-nourished. He is not diaphoretic.  HENT:     Head: Normocephalic and atraumatic.  Eyes:     Conjunctiva/sclera: Conjunctivae normal.  Cardiovascular:     Rate and Rhythm: Normal rate and regular rhythm.     Pulses:          Radial pulses are 2+ on the right side and 2+ on the left side.  Pulmonary:     Effort: Pulmonary effort is normal.  Musculoskeletal:     Cervical back: Neck supple.     Comments: Tenderness to the ulnar right wrist with questionable swelling.  No deformity, instability. Full range of motion in the cardinal directions of right wrist, however, he does have pain, especially with ulnar deviation. Full range of motion and function in the fingers of the right hand.  No pain or symptoms into the forearm or elbow.  Skin:    General: Skin is warm and dry.     Capillary Refill: Capillary refill takes less than 2 seconds.     Coloration: Skin is not pale.  Neurological:     Mental Status: He is alert.     Comments: Sensation grossly intact to light touch through each of the nerve distributions of the bilateral upper extremities. Abduction and adduction of the fingers intact against resistance. Grip strength equal bilaterally. Supination and pronation intact against resistance. Strength 5/5 through the cardinal directions of the bilateral wrists. Strength 5/5 with flexion and extension of the bilateral elbows. Patient can touch the thumb to each one of the fingertips without difficulty.  Patient can hold the "OK" sign against resistance.  Psychiatric:         Mood and Affect: Mood and affect normal.        Behavior: Behavior normal.     ED Results / Procedures / Treatments   Labs (all labs ordered are listed, but only abnormal results are displayed) Labs Reviewed - No data to display  EKG None  Radiology DG Wrist Complete Right  Result Date: 04/19/2020 CLINICAL DATA:  Right wrist pain. EXAM: RIGHT WRIST - COMPLETE 3+ VIEW COMPARISON:  None. FINDINGS: There is no evidence of fracture or dislocation. There is no evidence of arthropathy or other focal bone abnormality. Two well corticated circumscribed 3 mm osseous structures overlie the distal ulna, with likely no clinical significance. There is mild soft tissue swelling. IMPRESSION: 1. No acute fracture or dislocation identified about the right wrist. 2. Mild soft tissue swelling. Electronically Signed   By: Ted Mcalpine M.D.   On: 04/19/2020 14:24    Procedures Procedures   Medications Ordered in ED Medications  ibuprofen (ADVIL) tablet 600 mg (has no administration in time range)    ED Course  I have reviewed the triage vital signs and the nursing notes.  Pertinent labs & imaging results that were available during my care of the patient were reviewed by me and considered in my medical decision making (see chart for details).    MDM Rules/Calculators/A&P                          Patient presents with atraumatic right ulnar wrist pain. Septic joint was considered, however, presentation and history do not support this diagnosis. Patient placed in a splint for comfort. The patient was given instructions for home care as well as return precautions. Patient voices understanding of these instructions, accepts the plan, and is comfortable with discharge.  I reviewed and interpreted the patient's radiological studies.   Final Clinical Impression(s) / ED Diagnoses Final diagnoses:  Right wrist pain    Rx / DC Orders ED Discharge Orders         Ordered    ibuprofen  (ADVIL) 600 MG tablet  Every 6 hours PRN        04/19/20 1457    diclofenac Sodium (VOLTAREN) 1 % GEL  4 times daily        04/19/20 1457           Anselm Pancoast, PA-C 04/19/20 1502    Cathren Laine, MD 04/23/20 [phone]

## 2021-03-08 ENCOUNTER — Other Ambulatory Visit: Payer: Self-pay

## 2021-03-08 ENCOUNTER — Emergency Department (HOSPITAL_COMMUNITY): Payer: Self-pay

## 2021-03-08 ENCOUNTER — Encounter (HOSPITAL_COMMUNITY): Payer: Self-pay

## 2021-03-08 ENCOUNTER — Emergency Department (HOSPITAL_COMMUNITY)
Admission: EM | Admit: 2021-03-08 | Discharge: 2021-03-08 | Disposition: A | Discharge status: Home / Self Care | Payer: Self-pay | Attending: Emergency Medicine | Admitting: Emergency Medicine

## 2021-03-08 DIAGNOSIS — M25561 Pain in right knee: Secondary | ICD-10-CM | POA: Insufficient documentation

## 2021-03-08 LAB — CBC WITH DIFFERENTIAL/PLATELET
Abs Immature Granulocytes: 0.01 10*3/uL (ref 0.00–0.07)
Basophils Absolute: 0 10*3/uL (ref 0.0–0.1)
Basophils Relative: 0 %
Eosinophils Absolute: 0 10*3/uL (ref 0.0–0.5)
Eosinophils Relative: 0 %
HCT: 43 % (ref 39.0–52.0)
Hemoglobin: 14.1 g/dL (ref 13.0–17.0)
Immature Granulocytes: 0 %
Lymphocytes Relative: 29 %
Lymphs Abs: 2.4 10*3/uL (ref 0.7–4.0)
MCH: 28.1 pg (ref 26.0–34.0)
MCHC: 32.8 g/dL (ref 30.0–36.0)
MCV: 85.8 fL (ref 80.0–100.0)
Monocytes Absolute: 0.8 10*3/uL (ref 0.1–1.0)
Monocytes Relative: 9 %
Neutro Abs: 5.3 10*3/uL (ref 1.7–7.7)
Neutrophils Relative %: 62 %
Platelets: 206 10*3/uL (ref 150–400)
RBC: 5.01 MIL/uL (ref 4.22–5.81)
RDW: 15.5 % (ref 11.5–15.5)
WBC: 8.5 10*3/uL (ref 4.0–10.5)
nRBC: 0 % (ref 0.0–0.2)

## 2021-03-08 MED ORDER — OXYCODONE-ACETAMINOPHEN 5-325 MG PO TABS
1.0000 | ORAL_TABLET | Freq: Once | ORAL | Status: AC
  Administered 2021-03-08: 1 via ORAL
  Filled 2021-03-08: qty 1

## 2021-03-08 MED ORDER — PREDNISONE 10 MG (21) PO TBPK: ORAL_TABLET | Freq: Every day | ORAL | 0 refills | Status: DC

## 2021-03-08 NOTE — ED Provider Triage Note (Signed)
Emergency Medicine Provider Triage Evaluation Note  Alvin Anderson , a 41 y.o. male  was evaluated in triage.  Pt complains of right knee pain and swelling intermittently over the past week.  denies fever, chills, or injury.  Does endorse remote injury 1999 where he dislocated his knee playing basketball.  Review of Systems  Positive: As above Negative: As above  Physical Exam  BP (!) 160/104 (BP Location: Left Arm)    Pulse 95    Temp 98 F (36.7 C) (Oral)    Resp 14    Ht 6' (1.829 m)    Wt 86.2 kg    SpO2 98%    BMI 25.77 kg/m  Gen:   Awake, no distress   Resp:  Normal effort  MSK:   Moves extremities without difficulty.  Full extension present on right knee joint.  Flexion limited secondary to pain.  Mild swelling compared to contralateral joint visible.  Exquisitely tender over the medial aspect of the knee joint.  Antalgic gait.  2+ pedal pulse present bilaterally. Other:    Medical Decision Making  Medically screening exam initiated at 2:38 PM.  Appropriate orders placed.  Alvin Anderson was informed that the remainder of the evaluation will be completed by another provider, this initial triage assessment does not replace that evaluation, and the importance of remaining in the ED until their evaluation is complete.     Marita Kansas, PA-C 03/08/21 1441

## 2021-03-08 NOTE — ED Notes (Signed)
Pt d/c by PA, pt from dpt on crutches.

## 2021-03-08 NOTE — ED Notes (Signed)
Crutches given with education, pt verbalized and demonstrated understanding, pt states that his pain is a 7/10, pt has elevated bp, PA notified.

## 2021-03-08 NOTE — ED Notes (Signed)
Pt in bed, pt c/o R knee swelling, states that the pain has been getting worse over the past few days.  Ice pack given, pt states that he has taken tylenol without relief.

## 2021-03-08 NOTE — Discharge Instructions (Addendum)
Your work-up today did not show any fracture or other acute concerns for your knee pain.  Your blood work did not show concern for infection.  Your x-ray did show hardening of the ligament on the inside of your right knee.  This could be inflamed causing your current symptoms.  I have sent in prednisone to your pharmacy and provided you with orthopedic follow-up.  Please give them a call to schedule a follow-up appointment.  If you have worsening symptoms you can return to the emergency room.

## 2021-03-08 NOTE — ED Notes (Signed)
Sent down a light green to lab

## 2021-03-08 NOTE — ED Provider Notes (Signed)
[facility]-EMERGENCY DEPT Provider Note   CSN: [id number] Arrival date & time: 03/08/21  1416     History  Chief Complaint  Patient presents with   Joint Swelling    Alvin Anderson is a 41 y.o. male.  41 year old male presents today for evaluation of progressive severe right knee pain of 1 week duration.  Patient denies fever, chills, injury.  Patient reports he has intermittent flareups with swelling and severe pain he is unable to walk.  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  Patient does report an injury he suffered in 1999 playing basketball where he dislocated his right knee.  The history is provided by the patient. No language interpreter was used.      Home Medications Prior to Admission medications   Medication Sig Start Date End Date Taking? Authorizing Provider  predniSONE (STERAPRED UNI-PAK 21 TAB) 10 MG (21) TBPK tablet Take by mouth daily. Take 6 tabs by mouth daily  for 2 days, then 5 tabs for 2 days, then 4 tabs for 2 days, then 3 tabs for 2 days, 2 tabs for 2 days, then 1 tab by mouth daily for 2 days 03/08/21  Yes Karie MainlandAli, Quenesha Douglass, PA-C  diclofenac Sodium (VOLTAREN) 1 % GEL Apply 2 g topically 4 (four) times daily. 04/19/20   Joy, Shawn C, PA-C  famotidine (PEPCID) 20 MG tablet Take 1 tablet (20 mg total) by mouth 2 (two) times daily. Patient not taking: Reported on 07/17/2019 03/23/18   Palumbo, April, MD  ibuprofen (ADVIL) 600 MG tablet Take 1 tablet (600 mg total) by mouth every 6 (six) hours as needed. 04/19/20   Joy, Shawn C, PA-C  naproxen sodium (ALEVE) 220 MG tablet Take 220 mg by mouth daily as needed (for pain).    [provider]      Allergies    Amoxicillin    Review of Systems   Review of Systems  Constitutional:  Negative for chills and fever.  Musculoskeletal:  Positive for arthralgias, gait problem (Antalgic gait) and joint swelling.  All  other systems reviewed and are negative.  Physical Exam Updated Vital Signs BP (!) 160/104 (BP Location: Left Arm)    Pulse 95    Temp 98 F (36.7 C) (Oral)    Resp 14    Ht 6' (1.829 m)    Wt 86.2 kg    SpO2 98%    BMI 25.77 kg/m  Physical Exam Vitals and nursing note reviewed.  Constitutional:      General: He is not in acute distress.    Appearance: Normal appearance. He is not ill-appearing.  HENT:     Head: Normocephalic and atraumatic.     Nose: Nose normal.  Eyes:     Conjunctiva/sclera: Conjunctivae normal.  Cardiovascular:     Rate and Rhythm: Normal rate and regular rhythm.  Pulmonary:     Effort: Pulmonary effort is normal. No respiratory distress.  Musculoskeletal:        General: No deformity.     Right lower leg: No edema.     Left lower leg: No edema.     Comments: Right knee without visible deformity.  Mild swelling to the medial aspect appreciated visually.  Significant tenderness to palpation present over medial aspect of right knee.  Full extension present right knee.  Limited flexion secondary to pain.  Ankle with full range of motion,  hip with full range of motion.  2+ DP pulses present bilaterally.  Skin:    Findings: No rash.  Neurological:     Mental Status: He is alert.    ED Results / Procedures / Treatments   Labs (all labs ordered are listed, but only abnormal results are displayed) Labs Reviewed  CBC WITH DIFFERENTIAL/PLATELET    EKG None  Radiology DG Knee Complete 4 Views Right  Result Date: 03/08/2021 CLINICAL DATA:  Right knee pain and swelling. EXAM: RIGHT KNEE - COMPLETE 4+ VIEW COMPARISON:  07/25/2006 FINDINGS: No evidence for an acute fracture. No subluxation or dislocation. Degenerative spurring noted patellofemoral and lateral compartments. No substantial joint effusion. Sequelae of Pellegrini-Stieda disease evident. IMPRESSION: No acute bony abnormality. Electronically Signed   By: Kennith Center M.D.   On: 03/08/2021 14:56     Procedures Procedures    Medications Ordered in ED Medications - No data to display  ED Course/ Medical Decision Making/ A&P Clinical Course as of 03/08/21 1733  Mon Mar 08, 2021  1635 DG Knee Complete 4 Views Right [AA]    Clinical Course User Index [AA] Marita Kansas, PA-C                           Medical Decision Making  Medical Decision Making / ED Course   This patient presents to the ED for concern of right knee pain, this involves an extensive number of treatment options, and is a complaint that carries with it a high risk of complications and morbidity.  The differential diagnosis includes MSK injury including sprain, septic arthritis, septic bursitis  MDM: 41 year old male presents today for evaluation of right knee pain.  He is afebrile, denies chills, without leukocytosis, without injury.  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  Without concern for septic arthritis given no joint involvement on x-ray, without erythema or diffuse swelling.  With the exception of the MCL region patient able to tolerate palpation without tenderness.  Will prescribe prednisone pack and refer to orthopedics for further management.  Patient voices understanding and is in agreement with plan.   Lab Tests: -I ordered, reviewed, and interpreted labs.   The pertinent results include:   Labs Reviewed  CBC WITH DIFFERENTIAL/PLATELET      EKG  EKG Interpretation  Date/Time:    Ventricular Rate:    PR Interval:    QRS Duration:   QT Interval:    QTC Calculation:   R Axis:     Text Interpretation:           Imaging Studies ordered: I ordered imaging studies including right knee x-ray I independently visualized and interpreted imaging. I agree with the radiologist interpretation   Medicines ordered and prescription drug management: Meds ordered this encounter  Medications   predniSONE  (STERAPRED UNI-PAK 21 TAB) 10 MG (21) TBPK tablet    Sig: Take by mouth daily. Take 6 tabs by mouth daily  for 2 days, then 5 tabs for 2 days, then 4 tabs for 2 days, then 3 tabs for 2 days, 2 tabs for 2 days, then 1 tab by mouth daily for 2 days    Dispense:  42 tablet    Refill:  0    Order Specific Question:   Supervising Provider    Answer:   MILLER, BRIAN [[id number]]    -I have reviewed the  patients home medicines and have made adjustments as needed  Reevaluation: After the interventions noted above, I reevaluated the patient and found that they have :stayed the same   Past Medical History:  Diagnosis Date   Dislocated knee       Dispostion: Patient is appropriate for discharge.  Patient discharged in stable condition.  Referral to orthopedics provided.  Patient has elevated blood pressure particularly diastolic.  Patient is asymptomatic.  Provided resources to establish primary care and have follow-up for this.  Patient at the time of discharge had a blood pressure of 150/115 which was checked by me.   Final Clinical Impression(s) / ED Diagnoses Final diagnoses:  Acute pain of right knee    Rx / DC Orders ED Discharge Orders          Ordered    predniSONE (STERAPRED UNI-PAK 21 TAB) 10 MG (21) TBPK tablet  Daily        03/08/21 1732              Marita Kansas, PA-C 03/08/21 1858    Franne Forts, DO 03/12/21 2352

## 2021-03-08 NOTE — ED Triage Notes (Signed)
Patient reports that he dislocated his right knee in 1999, but for the past week he has intermittent flare ups where he has been having swelling and pain to the right knee.

## 2022-01-27 ENCOUNTER — Emergency Department (HOSPITAL_COMMUNITY)
Admission: EM | Admit: 2022-01-27 | Discharge: 2022-01-27 | Disposition: A | Discharge status: Home / Self Care | Payer: Self-pay | Attending: Emergency Medicine | Admitting: Emergency Medicine

## 2022-01-27 ENCOUNTER — Other Ambulatory Visit: Payer: Self-pay

## 2022-01-27 ENCOUNTER — Emergency Department (HOSPITAL_COMMUNITY): Payer: Self-pay

## 2022-01-27 DIAGNOSIS — X58XXXA Exposure to other specified factors, initial encounter: Secondary | ICD-10-CM | POA: Insufficient documentation

## 2022-01-27 DIAGNOSIS — S86002A Unspecified injury of left Achilles tendon, initial encounter: Secondary | ICD-10-CM | POA: Insufficient documentation

## 2022-01-27 MED ORDER — HYDROCODONE-ACETAMINOPHEN 5-325 MG PO TABS
1.0000 | ORAL_TABLET | Freq: Once | ORAL | Status: AC
  Administered 2022-01-27: 1 via ORAL
  Filled 2022-01-27: qty 1

## 2022-01-27 MED ORDER — HYDROCODONE-ACETAMINOPHEN 5-325 MG PO TABS: 2.0000 | ORAL_TABLET | ORAL | 0 refills | Status: DC | PRN

## 2022-01-27 NOTE — ED Triage Notes (Signed)
C/o of ambulating last night and hearing a pop and sudden pain to heel of left foot.  Pain worse when planter flexion.

## 2022-01-27 NOTE — Discharge Instructions (Addendum)
You may have another partial tear of the Achilles tendon  Please take Tylenol or Motrin for pain and Norco for severe pain  Please wear the boot and crutches.    I have referred you back to [facility] for follow-up  Return to ER if you have worse ankle pain or unable to walk.

## 2022-01-27 NOTE — ED Provider Notes (Signed)
[facility]-EMERGENCY DEPT Provider Note   CSN: [id number] Arrival date & time: 01/27/22  1620     History  Chief Complaint  Patient presents with   Foot Pain    Alvin Anderson is a 41 y.o. male here presenting with left ankle pain.  Patient states that he had a partial Achilles tendon tear that was treated with a boot.  He states that he was walking and had sudden pain in the back of his ankle.  He states that he is still able to point his toes down but has severe pain when he tries to walk.  Denies any trauma or injury.  The history is provided by the patient.       Home Medications Prior to Admission medications   Medication Sig Start Date End Date Taking? Authorizing Provider  diclofenac Sodium (VOLTAREN) 1 % GEL Apply 2 g topically 4 (four) times daily. 04/19/20   Joy, Shawn C, PA-C  famotidine (PEPCID) 20 MG tablet Take 1 tablet (20 mg total) by mouth 2 (two) times daily. Patient not taking: Reported on 07/17/2019 03/23/18   Palumbo, April, MD  ibuprofen (ADVIL) 600 MG tablet Take 1 tablet (600 mg total) by mouth every 6 (six) hours as needed. 04/19/20   Joy, Shawn C, PA-C  naproxen sodium (ALEVE) 220 MG tablet Take 220 mg by mouth daily as needed (for pain).    [provider]  predniSONE (STERAPRED UNI-PAK 21 TAB) 10 MG (21) TBPK tablet Take by mouth daily. Take 6 tabs by mouth daily  for 2 days, then 5 tabs for 2 days, then 4 tabs for 2 days, then 3 tabs for 2 days, 2 tabs for 2 days, then 1 tab by mouth daily for 2 days 03/08/21   Marita Kansas, PA-C      Allergies    Amoxicillin    Review of Systems   Review of Systems  Musculoskeletal:        Left ankle pain  All other systems reviewed and are negative.   Physical Exam Updated Vital Signs BP (!) 128/92 (BP Location: Left Arm)   Pulse 78   Temp 98 F (36.7 C) (Oral)   Resp 18   SpO2 99%  Physical Exam Vitals and nursing note reviewed.  HENT:     Head: Normocephalic.      Mouth/Throat:     Mouth: Mucous membranes are moist.  Eyes:     Pupils: Pupils are equal, round, and reactive to light.  Cardiovascular:     Rate and Rhythm: Normal rate.     Pulses: Normal pulses.  Pulmonary:     Effort: Pulmonary effort is normal.  Musculoskeletal:     Cervical back: Normal range of motion.     Comments: Patient has some tenderness over the Achilles tendon.  Patient has negative Thompson sign and able to plantarflex.  2+ pedal pulse.  Skin:    General: Skin is warm.     Capillary Refill: Capillary refill takes less than 2 seconds.  Neurological:     General: No focal deficit present.     Mental Status: He is alert and oriented to person, place, and time.  Psychiatric:        Mood and Affect: Mood normal.        Behavior: Behavior normal.     ED Results / Procedures / Treatments   Labs (all labs ordered are listed, but only abnormal results are displayed) Labs Reviewed - No data  to display  EKG None  Radiology DG Ankle Complete Left  Result Date: 01/27/2022 CLINICAL DATA:  Posterior ankle pain. History of Achilles tendon tear in the same region. EXAM: LEFT ANKLE COMPLETE - 3+ VIEW COMPARISON:  Left ankle radiographs 06/13/2016, left foot radiographs 07/25/2016, MRI left ankle 08/19/2016 FINDINGS: Small plantar calcaneal heel spur. Mild distal medial malleolar degenerative osteophytosis. Mild dorsal talonavicular, navicular-cuneiform, and tarsometatarsal degenerative osteophytes. No acute fracture or dislocation. IMPRESSION: 1. Mild midfoot osteoarthritis. 2. Small plantar calcaneal heel spur. Electronically Signed   By: Neita Garnet M.D.   On: 01/27/2022 17:59    Procedures Procedures    Medications Ordered in ED Medications  HYDROcodone-acetaminophen (NORCO/VICODIN) 5-325 MG per tablet 1 tablet (1 tablet Oral Given 01/27/22 2147)    ED Course/ Medical Decision Making/ A&P                           Medical Decision Making Kelson JAMAIL CULLERS is a 41  y.o. male here presenting with pain at the Achilles tendon.  Patient had previous partial tear of the Achilles tendon.  Patient is able to plantarflex still and has negative Thompson sign.  I think he may have another partial Achilles tendon tear.  Patient states that he was treated with a boot and crutches last time.  He followed up with Dr. Logan Bores from podiatry.  Will give a boot and crutches and pain medicine.  Will have him follow-up with [facility].   Problems Addressed: Achilles tendon injury, left, initial encounter: acute illness or injury  Amount and/or Complexity of Data Reviewed Radiology: ordered and independent interpretation performed. Decision-making details documented in ED Course.  Risk Prescription drug management.    Final Clinical Impression(s) / ED Diagnoses Final diagnoses:  None    Rx / DC Orders ED Discharge Orders     None         Charlynne Pander, MD 01/27/22 2207

## 2022-01-27 NOTE — ED Provider Triage Note (Signed)
Emergency Medicine Provider Triage Evaluation Note  Alvin Anderson , a 41 y.o. male  was evaluated in triage.  Pt complains of pain in the back of his ankle.  Pt had a previous achilles tear in the same area.  Pt reports no injury.  Review of Systems  Positive: pain Negative: No injury  Physical Exam  BP (!) 128/92 (BP Location: Left Arm)   Pulse 78   Temp 98 F (36.7 C) (Oral)   Resp 18   SpO2 99%  Gen:   Awake, no distress   Resp:  Normal effort  MSK:   Tender left achillse  Other:    Medical Decision Making  Medically screening exam initiated at 5:19 PM.  Appropriate orders placed.  Alvin Anderson was informed that the remainder of the evaluation will be completed by another provider, this initial triage assessment does not replace that evaluation, and the importance of remaining in the ED until their evaluation is complete.     Elson Areas, [place] 01/27/22 1721

## 2022-03-08 ENCOUNTER — Other Ambulatory Visit: Payer: Self-pay

## 2022-03-08 ENCOUNTER — Emergency Department (HOSPITAL_COMMUNITY)
Admission: EM | Admit: 2022-03-08 | Discharge: 2022-03-08 | Disposition: A | Discharge status: Home / Self Care | Payer: Self-pay | Attending: Emergency Medicine | Admitting: Emergency Medicine

## 2022-03-08 ENCOUNTER — Encounter (HOSPITAL_COMMUNITY): Payer: Self-pay

## 2022-03-08 DIAGNOSIS — M7662 Achilles tendinitis, left leg: Secondary | ICD-10-CM | POA: Insufficient documentation

## 2022-03-08 MED ORDER — NAPROXEN 500 MG PO TABS: 500.0000 mg | ORAL_TABLET | Freq: Two times a day (BID) | ORAL | 0 refills | Status: DC

## 2022-03-08 NOTE — ED Provider Triage Note (Addendum)
Emergency Medicine Provider Triage Evaluation Note  Alvin Anderson , a 41 y.o. male  was evaluated in triage.  Pt complains of worsening left lower leg pain.  Hx of partial Achilles tear in the past.  Seen 1 month ago in ED for similar pain, diagnosed with likely repeat partial Achilles tear.  Was recommended follow-up with Ortho, however without PCP and unable to pay upfront costs, so did not follow-up with orthopedics.  Tried slowly returning to work and starting to [employer].  Noticed significant increase in pain, and new "knot" on the left back lower calf.  Denies active numbness or tingling.  Denies new injury since last visit.  Review of Systems  Positive:  Negative: See above  Physical Exam  There were no vitals taken for this visit. Gen:   Awake, no distress   Resp:  Normal effort  MSK:   Moves extremities without difficulty  Other:  Left lower extremity in cam boot.  ROM and weightbearing deferred.  Able to wiggle toes.  Appears neurovascularly intact.  Medical Decision Making  Medically screening exam initiated at 1:01 PM.  Appropriate orders placed.  Knolan S Lemaster was informed that the remainder of the evaluation will be completed by another provider, this initial triage assessment does not replace that evaluation, and the importance of remaining in the ED until their evaluation is complete.      Prince Rome, PA-C 84/13/24 1307

## 2022-03-08 NOTE — Discharge Instructions (Addendum)
Expect a call from Social Work team to assist in followup. Consider calling Orthopedic doctor at the number provided and see if they can assist in payment plans.

## 2022-03-08 NOTE — Care Management (Signed)
Transition of Care [facility]) - Emergency Department Mini Assessment   Patient Details  Name: Alvin Anderson MRN: [id number] Date of Birth: 18-Apr-1980  [facility] CM/SW Contact:    Roseanne Kaufman, RN Phone Number: 03/08/2022, 4:50 PM   Clinical Narrative: Jackquline [place] consult for PCP needs. This RNCM spoke with patient who reports he has no insurance and can not afford ortho appointment cost. This RNCM spoke with [facility] who reports the earliest appointment is 04/08/22. This RNCM will contact other [facility] PCP providers to coordinate appointment on tomorrow due to being after hours. Notified patient to expect call tomorrow.   This RNCM will continue to follow post discharge.    ED Mini Assessment: What brought you to the Emergency Department? : left foot pain  Barriers to Discharge: No Barriers Identified  Barrier interventions: PCP appointment     Interventions which prevented an admission or readmission: PCP Appointment Scheduled    Patient Contact and Communications    Patient :[phone]     ,              Choice offered to / list presented to : Patient  Admission diagnosis:  Left Achilies Pain There are no problems to display for this patient.  PCP:  Patient, No Pcp Per Pharmacy:   [facility] [place], [place] [place] [address] Phone: [phone] Fax: [phone]  [place], [address] [place] [place] [address] Phone: [phone] Fax: [phone]

## 2022-03-08 NOTE — ED Provider Notes (Signed)
[facility] DEPT Provider Note   CSN: [id number] Arrival date & time: 03/08/22  1222     History  Chief Complaint  Patient presents with   Ankle Pain    Alvin Anderson is a 42 y.o. male.  HPI    Multiple 42 year old male comes in with chief complaint of ankle pain.  Patient comes in with concerns of worsening discomfort around his left ankle.  Patient had injured his left foot sometime in December.  He was unable to follow-up with outpatient podiatrist as they requested $250 upfront.  Now he is noticing increased swelling over the left Achilles area and he continues to have pain and discomfort.  He has not been able to go back to work.   Home Medications Prior to Admission medications   Medication Sig Start Date End Date Taking? Authorizing Provider  naproxen (NAPROSYN) 500 MG tablet Take 1 tablet (500 mg total) by mouth 2 (two) times daily. 03/08/22  Yes Varney Biles, MD  diclofenac Sodium (VOLTAREN) 1 % GEL Apply 2 g topically 4 (four) times daily. 04/19/20   Joy, Shawn C, PA-C  famotidine (PEPCID) 20 MG tablet Take 1 tablet (20 mg total) by mouth 2 (two) times daily. Patient not taking: Reported on 07/17/2019 03/23/18   Palumbo, April, MD  HYDROcodone-acetaminophen (NORCO/VICODIN) 5-325 MG tablet Take 2 tablets by mouth every 4 (four) hours as needed. 01/27/22   Drenda Freeze, MD  ibuprofen (ADVIL) 600 MG tablet Take 1 tablet (600 mg total) by mouth every 6 (six) hours as needed. 04/19/20   Joy, Shawn C, PA-C  naproxen sodium (ALEVE) 220 MG tablet Take 220 mg by mouth daily as needed (for pain).    [provider]  predniSONE (STERAPRED UNI-PAK 21 TAB) 10 MG (21) TBPK tablet Take by mouth daily. Take 6 tabs by mouth daily  for 2 days, then 5 tabs for 2 days, then 4 tabs for 2 days, then 3 tabs for 2 days, 2 tabs for 2 days, then 1 tab by mouth daily for 2 days 03/08/21   Evlyn Courier, PA-C      Allergies    Amoxicillin    Review of  Systems   Review of Systems  Physical Exam Updated Vital Signs BP (!) 169/98   Pulse 76   Temp 98.6 F (37 C) (Oral)   Resp 19   Wt 86 kg   SpO2 98%   BMI 25.71 kg/m  Physical Exam Vitals and nursing note reviewed.  Constitutional:      Appearance: He is well-developed.  HENT:     Head: Atraumatic.  Cardiovascular:     Rate and Rhythm: Normal rate.  Pulmonary:     Effort: Pulmonary effort is normal.  Musculoskeletal:        General: Swelling and tenderness present.     Cervical back: Neck supple.     Comments: Patient has swelling just lateral to the Achilles tendon insertion site.  There is tenderness to palpation.  No evidence of infection.  Patient having difficulty with dorsiflexion  Skin:    General: Skin is warm.  Neurological:     Mental Status: He is alert and oriented to person, place, and time.     ED Results / Procedures / Treatments   Labs (all labs ordered are listed, but only abnormal results are displayed) Labs Reviewed - No data to display  EKG None  Radiology No results found.  Procedures Procedures    Medications Ordered  in ED Medications - No data to display  ED Course/ Medical Decision Making/ A&P                             Medical Decision Making 42 year old male comes in with chief complaint of ankle pain.  He was seen in the ER in December, last month with same complaint.  He was put in a cam walker boot and advised to follow-up with podiatry service.  Patient alleges that the podiatry service would not see him unless they paid $250 co-pay upfront.   Unfortunately, my suspicion is that patient is developing bursitis.  He is also developing stiffness to the Achilles area that now might require physical therapy.  He carries the social determinants of health, which is lack of insurance, which is a barrier.  The injury is affecting his ability to keep his gainful employment, which further puts him back in terms of access and ultimately  could lead to chronic issues.  I have consulted the social work team.  I spoke with them individually.  TOC team will try to get him into: Wellness and then he can get consultation for orthopedic service as needed.  I will give him the phone number for orthopedic surgery on-call, if they are able to work with him on payment plan then he might be able to see them sooner.  I will also given him work note that allows him to return to work.  Problems Addressed: Achilles bursitis of left lower extremity: complicated acute illness or injury  Risk Prescription drug management.    Final Clinical Impression(s) / ED Diagnoses Final diagnoses:  Achilles bursitis of left lower extremity    Rx / DC Orders ED Discharge Orders          Ordered    naproxen (NAPROSYN) 500 MG tablet  2 times daily        03/08/22 1633              Varney Biles, MD 03/08/22 1853

## 2022-03-08 NOTE — ED Triage Notes (Signed)
C/o pain behind left ankle with knot and numbness to toes  Reports 1 month ago treated with cam boot and went back to work and pain increased.  Pain worse with pressure.  Unable to f/u with ortho due to co-pay

## 2022-03-09 ENCOUNTER — Telehealth: Payer: Self-pay

## 2022-03-09 NOTE — Telephone Encounter (Signed)
This RNCM spoke with Alvin Anderson at [facility], who scheduled a follow up appointment on 03/21/22 at 10:45am.   This RNCM left voicemail message for patient to advise of appointment date and time, left call back information.

## 2022-03-11 ENCOUNTER — Telehealth: Payer: Self-pay

## 2022-03-11 NOTE — Telephone Encounter (Signed)
This RNCM received a call back from patient stating he was returning a call from earlier this week. This RNCM advised patient that he has a PCP appointment with [facility] on 03/21/22 at 10:45am. [phone]. This RNCM also advised a voicemail was left with details. Patient reports he does not normally listen to his voicemail.  No additional TOC needs.

## 2022-03-21 ENCOUNTER — Other Ambulatory Visit: Payer: Self-pay

## 2022-03-21 ENCOUNTER — Encounter: Payer: Self-pay | Admitting: Student

## 2022-03-21 ENCOUNTER — Ambulatory Visit (INDEPENDENT_AMBULATORY_CARE_PROVIDER_SITE_OTHER): Payer: Self-pay | Admitting: Student

## 2022-03-21 VITALS — BP 152/103 | HR 70 | Temp 97.6°F | Ht 72.0 in | Wt 212.3 lb

## 2022-03-21 DIAGNOSIS — R030 Elevated blood-pressure reading, without diagnosis of hypertension: Secondary | ICD-10-CM

## 2022-03-21 DIAGNOSIS — S86002D Unspecified injury of left Achilles tendon, subsequent encounter: Secondary | ICD-10-CM

## 2022-03-21 NOTE — Patient Instructions (Signed)
Thank you, Mr.Kaidan S Zeidman for allowing [place] to provide your care today. Today we discussed your recent Achilles injury.  Continue well in the boot and follow-up with [place] in 1 month.  Please make sure to fill out the [facility] card application as soon as possible.  I have attached an exercise resource for you to do at home.  My Chart Access: [email or web address]  Please follow-up in 1 month  Please make sure to arrive 15 minutes prior to your next appointment. If you arrive late, you may be asked to reschedule.    We look forward to seeing you next time. Please call our clinic at [phone] if you have any questions or concerns. The best time to call is Monday-Friday from 9am-4pm, but there is someone available 24/7. If after hours or the weekend, call the main hospital number and ask for the Internal Medicine Resident On-Call. If you need medication refills, please notify your pharmacy one week in advance and they will send [place] a request.   Thank you for letting us take part in your care. Wishing you the best!  Lacinda Axon, MD 03/21/2022, 12:05 PM IM Resident, PGY-3 Oswaldo Milian 41:10

## 2022-03-21 NOTE — Progress Notes (Unsigned)
CC: Left Achilles injury  HPI:  Mr.Alvin Anderson is a 42 y.o. new patient with PMH as below accompanied by his girlfriend who presents to clinic for evaluation of left Achilles injury and a letter to remove restrictions at work. Please see problem based charting for evaluation, assessment and plan.  Past Medical History:  Diagnosis Date   Dislocated knee    Review of Systems:  Constitutional: Negative for fever or fatigue Eyes: Negative for visual changes MSK: Positive for left foot injury and pain Neuro: Negative for headache, numbness or weakness  Physical Exam: General: Pleasant, well-appearing middle-age man. No acute distress. Cardiac: RRR. No murmurs, rubs or gallops. No LE edema Respiratory: Lungs CTAB. No wheezing or crackles. Abdominal: Soft, symmetric and non tender. Normal BS. Skin: Warm, dry and intact without rashes or lesions MSK: Left foot in a boot. Mild tenderness to palpation to the insertion of the left Achilles tendon without edema or erythema. No tenderness to palpation of the lateral or medial malleolus or the plantar aspect of the foot.  Normal dorsiflexion but pain with forced plantarflexion. Extremities: Atraumatic. Full ROM. Palpable radial and DP pulses. Neuro: A&O x 3. Moves all extremities. Normal sensation to gross touch.  Ambulates with a limp. Psych: Appropriate mood and affect.  Vitals:   03/21/22 1108 03/21/22 1139  BP: (!) 148/98 (!) 152/103  Pulse: 70 70  Temp: 97.6 F (36.4 C)   TempSrc: Oral   SpO2: 100% 100%  Weight: 212 lb 4.8 oz (96.3 kg)   Height: 6' (1.829 m)     Assessment & Plan:   Injury of left Achilles tendon Patient referred to to our clinic from ER provider after recent evaluation for left Achilles injury. Patient reports that he has a history of Achilles rupture about 4-5 years ago that was managed with a boot by podiatry. Patient reports that in early December 2023, while walking in his living room, he heard a pop  in his left lower extremity and suddenly started having pain in his left ankle with difficulty ambulating. He was evaluated in the ER and x-ray of the ankle showed mild midfoot osteoarthritis and small plantar calcaneus heel spur.  He was diagnosed with possible partial tear of his Achilles tendon. He was given pain medicine, crutches sent, boots and referred to podiatry. Patient states he tried to get an appointment with podiatry but they were charging him $250 upfront which he did not have. 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  He presented back to the ER 2 weeks ago due to increased swelling in his left Achilles area and was diagnosed with possible Achilles bursitis.  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  A/P Young 42 year old here for evaluation of left Achilles injury sustained about 6 to 7 weeks ago. 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  Patient was evaluated by podiatry and received steroid injection for plantar fasciitis of the left foot. On exam today, patient is seen with a full cam boot.  There is tenderness to palpation of the Achilles tendon as well as pain with forced plantarflexion of the foot otherwise no other abnormalities. Patient has a slight limp but  able to ambulate.  Patient will require an ultrasound of the foot to formally rule out an Achilles pathology however patient is currently without coverage.  Gave patient the orange card application to fill out so she can get the appropriate medical care.  Plan: -Continue OTC Tylenol as needed for pain -Completed work note removing restrictions -Provided orange card application for patient to fill for coverage -Follow-up in 1 month to establish care -Will discuss with sports medicine to see if patient can be seen at a discounted cost.  Elevated blood pressure reading Patient here for evaluation of left Achilles injury found to have elevated blood pressure with SBP in the 140s to 150s. This is patient's first elevated BP readings and also in the setting of Achilles tendon injury. Patient will establish care in clinic after applying for the orange card application.  Will continue to monitor and assess need for initiating an antihypertensive medication. Patient reports significant tobacco use history of 1 ppd for the last 18 years but recently cut down to 1/2ppd.  Patient counseled on the effects of tobacco on blood pressure and lung function and advised to work towards smoking cessation. -Repeat BP at next office visit -Discussed smoking cessation formally and provide resources as needed    See Encounters Tab for problem based charting.  Patient discussed with Dr.  Dani Gobble, MD, MPH

## 2022-03-22 ENCOUNTER — Encounter: Payer: Self-pay | Admitting: Student

## 2022-03-22 DIAGNOSIS — S86002A Unspecified injury of left Achilles tendon, initial encounter: Secondary | ICD-10-CM | POA: Insufficient documentation

## 2022-03-22 DIAGNOSIS — R030 Elevated blood-pressure reading, without diagnosis of hypertension: Secondary | ICD-10-CM | POA: Insufficient documentation

## 2022-03-22 NOTE — Assessment & Plan Note (Signed)
Patient here for evaluation of left Achilles injury found to have elevated blood pressure with SBP in the 140s to 150s. This is patient's first elevated BP readings and also in the setting of Achilles tendon injury. Patient will establish care in clinic after applying for the orange card application.  Will continue to monitor and assess need for initiating an antihypertensive medication. Patient reports significant tobacco use history of 1 ppd for the last 18 years but recently cut down to 1/2ppd.  Patient counseled on the effects of tobacco on blood pressure and lung function and advised to work towards smoking cessation. -Repeat BP at next office visit -Discussed smoking cessation formally and provide resources as needed

## 2022-03-22 NOTE — Assessment & Plan Note (Addendum)
Patient referred to to our clinic from ER provider after recent evaluation for left Achilles injury. Patient reports that he has a history of Achilles rupture about 4-5 years ago that was managed with a boot by podiatry. Patient reports that in early December 2023, while walking in his living room, he heard a pop in his left lower extremity and suddenly started having pain in his left ankle with difficulty ambulating. He was evaluated in the ER and x-ray of the ankle showed mild midfoot osteoarthritis and small plantar calcaneus heel spur.  He was diagnosed with possible partial tear of his Achilles tendon. He was given pain medicine, crutches sent, boots and referred to podiatry. Patient states he tried to get an appointment with podiatry but they were charging him $250 upfront which he did not have. 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  He presented back to the ER 2 weeks ago due to increased swelling in his left Achilles area and was diagnosed with possible Achilles bursitis.  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  A/P Young 42 year old here for evaluation of left Achilles injury sustained about 6 to 7 weeks ago. 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 He reports a history of Achilles tendon rupture however on chart review, she presented to the ER 2018 with similar injury but subsequent MRI of the foot was negative for an Achilles tendon pathology but showed tarsal tunnel syndrome as well as ganglion cyst.  Patient was  evaluated by podiatry and received steroid injection for plantar fasciitis of the left foot. On exam today, patient is seen with a full cam boot.  There is tenderness to palpation of the Achilles tendon as well as pain with forced plantarflexion of the foot otherwise no other abnormalities. Patient has a slight limp but able to ambulate.  Patient will require an ultrasound of the foot to formally rule out an Achilles pathology however patient is currently without coverage.  Gave patient the orange card application to fill out so she can get the appropriate medical care.  Plan: -Continue OTC Tylenol as needed for pain -Completed work note removing restrictions -Provided orange card application for patient to fill for coverage -Follow-up in 1 month to establish care -Will discuss with sports medicine to see if patient can be seen at a discounted cost.

## 2022-03-24 NOTE — Progress Notes (Signed)
Internal Medicine Clinic Attending  Case discussed with Dr. Amponsah  At the time of the visit.  We reviewed the resident's history and exam and pertinent patient test results.  I agree with the assessment, diagnosis, and plan of care documented in the resident's note.  

## 2022-07-10 ENCOUNTER — Emergency Department (HOSPITAL_COMMUNITY)
Admission: EM | Admit: 2022-07-10 | Discharge: 2022-07-10 | Disposition: A | Discharge status: Home / Self Care | Payer: Self-pay | Attending: Emergency Medicine | Admitting: Emergency Medicine

## 2022-07-10 ENCOUNTER — Emergency Department (HOSPITAL_COMMUNITY): Payer: Self-pay

## 2022-07-10 ENCOUNTER — Other Ambulatory Visit: Payer: Self-pay

## 2022-07-10 DIAGNOSIS — F17200 Nicotine dependence, unspecified, uncomplicated: Secondary | ICD-10-CM | POA: Insufficient documentation

## 2022-07-10 DIAGNOSIS — M7701 Medial epicondylitis, right elbow: Secondary | ICD-10-CM | POA: Insufficient documentation

## 2022-07-10 LAB — BASIC METABOLIC PANEL
Anion gap: 10 (ref 5–15)
BUN: 13 mg/dL (ref 6–20)
CO2: 22 mmol/L (ref 22–32)
Calcium: 8.8 mg/dL — ABNORMAL LOW (ref 8.9–10.3)
Chloride: 102 mmol/L (ref 98–111)
Creatinine, Ser: 0.99 mg/dL (ref 0.61–1.24)
GFR, Estimated: >60 mL/min (ref >60)
Glucose, Bld: 70 mg/dL (ref 70–99)
Potassium: 3.6 mmol/L (ref 3.5–5.1)
Sodium: 134 mmol/L — ABNORMAL LOW (ref 135–145)

## 2022-07-10 LAB — CBC WITH DIFFERENTIAL/PLATELET
Abs Immature Granulocytes: 0.01 10*3/uL (ref 0.00–0.07)
Basophils Absolute: 0.1 10*3/uL (ref 0.0–0.1)
Basophils Relative: 1 %
Eosinophils Absolute: 0.1 10*3/uL (ref 0.0–0.5)
Eosinophils Relative: 2 %
HCT: 45 % (ref 39.0–52.0)
Hemoglobin: 14.7 g/dL (ref 13.0–17.0)
Immature Granulocytes: 0 %
Lymphocytes Relative: 35 %
Lymphs Abs: 2.6 10*3/uL (ref 0.7–4.0)
MCH: 28.4 pg (ref 26.0–34.0)
MCHC: 32.7 g/dL (ref 30.0–36.0)
MCV: 86.9 fL (ref 80.0–100.0)
Monocytes Absolute: 0.6 10*3/uL (ref 0.1–1.0)
Monocytes Relative: 8 %
Neutro Abs: 4 10*3/uL (ref 1.7–7.7)
Neutrophils Relative %: 54 %
Platelets: 236 10*3/uL (ref 150–400)
RBC: 5.18 MIL/uL (ref 4.22–5.81)
RDW: 15.3 % (ref 11.5–15.5)
WBC: 7.4 10*3/uL (ref 4.0–10.5)
nRBC: 0 % (ref 0.0–0.2)

## 2022-07-10 MED ORDER — CELECOXIB 200 MG PO CAPS: 200.0000 mg | ORAL_CAPSULE | Freq: Two times a day (BID) | ORAL | 0 refills | Status: AC

## 2022-07-10 MED ORDER — OXYCODONE HCL 5 MG PO TABS: 2.5000 mg | ORAL_TABLET | ORAL | 0 refills | Status: AC | PRN

## 2022-07-10 MED ORDER — OXYCODONE HCL 5 MG PO TABS
5.0000 mg | ORAL_TABLET | Freq: Once | ORAL | Status: AC
  Administered 2022-07-10: 5 mg via ORAL
  Filled 2022-07-10: qty 1

## 2022-07-10 NOTE — ED Provider Notes (Incomplete)
[place] EMERGENCY DEPARTMENT AT [facility] Provider Note   CSN: [id number] Arrival date & time: 07/10/22  1730     History {Add pertinent medical, surgical, social history, OB history to HPI:1} Chief Complaint  Patient presents with   Elbow Pain    Alvin Anderson is a 42 y.o. male who presents emergency department with chief complaint of elbow pain.  Patient complains of worsening pain on the medial side of his elbow for the past several weeks and now severe.  Patient is a Designer, fashion/clothing and states that he it has become so severe that he could barely bend the elbow at all.  He denies any injuries.  He is a smoker.  He has been taking up to 10 Aleve a day and last took that yesterday.  He does not have any stomach pain nausea vomiting or stool changes.  HPI     Home Medications Prior to Admission medications   Medication Sig Start Date End Date Taking? Authorizing Provider  naproxen (NAPROSYN) 500 MG tablet Take 1 tablet (500 mg total) by mouth 2 (two) times daily. 03/08/22   Derwood Kaplan, MD      Allergies    Amoxicillin    Review of Systems   Review of Systems  Physical Exam Updated Vital Signs BP 137/85   Pulse (!) 111   Temp 98.7 F (37.1 C) (Oral)   Resp 16   Ht 6' (1.829 m)   Wt 96.2 kg   SpO2 98%   BMI 28.75 kg/m  Physical Exam Musculoskeletal:     Comments: Right medial elbow with exquisite tenderness to palpation, no redness, no heat, no erythema, no swelling over the olecranon process.  Patient has no pain with phonation of the forearm but does have pain with supination.  He has significant pain with flexion extension of the right elbow.     ED Results / Procedures / Treatments   Labs (all labs ordered are listed, but only abnormal results are displayed) Labs Reviewed  CBC WITH DIFFERENTIAL/PLATELET  BASIC METABOLIC PANEL    EKG None  Radiology DG Elbow Complete Right  Result Date: 07/10/2022 CLINICAL DATA:  Worsening elbow pain  to the medial aspect of the right elbow for 3 weeks. Difficulty straightening elbow EXAM: RIGHT ELBOW - COMPLETE 3+ VIEW COMPARISON:  None Available. FINDINGS: There is no evidence of fracture or dislocation. Small elbow joint effusion. There is no evidence of arthropathy or other focal bone abnormality. Soft tissues are unremarkable. IMPRESSION: No acute fracture or dislocation.  Small elbow joint effusion. Electronically Signed   By: Minerva Fester M.D.   On: 07/10/2022 18:26    Procedures Procedures  {Document cardiac monitor, telemetry assessment procedure when appropriate:1}  Medications Ordered in ED Medications  oxyCODONE (Oxy IR/ROXICODONE) immediate release tablet 5 mg (5 mg Oral Given 07/10/22 1917)    ED Course/ Medical Decision Making/ A&P   {   Click here for ABCD2, HEART and other calculatorsREFRESH Note before signing :1}                          Medical Decision Making Amount and/or Complexity of Data Reviewed Labs: ordered. Radiology: ordered.  Risk Prescription drug management.   ***  {Document critical care time when appropriate:1} {Document review of labs and clinical decision tools ie heart score, Chads2Vasc2 etc:1}  {Document your independent review of radiology images, and any outside records:1} {Document your discussion with family members,  caretakers, and with consultants:1} {Document social determinants of health affecting pt's care:1} {Document your decision making why or why not admission, treatments were needed:1} Final Clinical Impression(s) / ED Diagnoses Final diagnoses:  None    Rx / DC Orders ED Discharge Orders     None

## 2022-07-10 NOTE — Discharge Instructions (Addendum)
Contact a health care provider if: Your pain does not improve or it gets worse. You notice numbness in your hand. Get help right away if: Your pain is severe. You cannot move your wrist. 

## 2022-07-10 NOTE — ED Triage Notes (Signed)
Pt arrived via POV. C/o R elbow for 3x weeks that has increased to point pt struggles to use arm.  AOx4

## 2022-09-23 ENCOUNTER — Encounter (HOSPITAL_COMMUNITY): Payer: Self-pay | Admitting: Emergency Medicine

## 2022-09-23 ENCOUNTER — Other Ambulatory Visit: Payer: Self-pay

## 2022-09-23 ENCOUNTER — Emergency Department (HOSPITAL_COMMUNITY)
Admission: EM | Admit: 2022-09-23 | Discharge: 2022-09-23 | Disposition: A | Discharge status: Home / Self Care | Payer: Self-pay | Attending: Emergency Medicine | Admitting: Emergency Medicine

## 2022-09-23 DIAGNOSIS — R030 Elevated blood-pressure reading, without diagnosis of hypertension: Secondary | ICD-10-CM | POA: Insufficient documentation

## 2022-09-23 DIAGNOSIS — L240 Irritant contact dermatitis due to detergents: Secondary | ICD-10-CM | POA: Insufficient documentation

## 2022-09-23 MED ORDER — BACITRACIN ZINC 500 UNIT/GM EX OINT: 1.0000 | TOPICAL_OINTMENT | Freq: Two times a day (BID) | CUTANEOUS | 0 refills | Status: AC

## 2022-09-23 MED ORDER — BACITRACIN 500 UNIT/GM EX OINT
TOPICAL_OINTMENT | CUTANEOUS | Status: DC | PRN
  Administered 2022-09-23: 1 via TOPICAL
  Filled 2022-09-23: qty 14

## 2022-09-23 NOTE — ED Triage Notes (Signed)
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  Stated he was cooking and noticed instant burning to neck.  No exposure to known fluids, heat, or chemicals.  Reports that multiple employees are complaining of same symptoms

## 2022-09-23 NOTE — ED Provider Notes (Incomplete)
[place] EMERGENCY DEPARTMENT AT [facility] Provider Note  CSN: [id number] Arrival date & time: 09/23/22 2211  Chief Complaint(s) Rash  HPI Alvin Anderson is a 42 y.o. male {Add pertinent medical, surgical, social history, OB history to HPI:1}    Rash   Past Medical History Past Medical History:  Diagnosis Date   Dislocated knee    Patient Active Problem List   Diagnosis Date Noted   Injury of left Achilles tendon 03/22/2022   Elevated blood pressure reading 03/22/2022   Home Medication(s) Prior to Admission medications   Medication Sig Start Date End Date Taking? Authorizing Provider  celecoxib (CELEBREX) 200 MG capsule Take 1 capsule (200 mg total) by mouth 2 (two) times daily. 07/10/22   Harris, Cammy Copa, PA-C  oxyCODONE (ROXICODONE) 5 MG immediate release tablet Take 0.5-1 tablets (2.5-5 mg total) by mouth every 4 (four) hours as needed for severe pain. 07/10/22   Arthor Captain, PA-C                                                                                                                                    Allergies Amoxicillin  Review of Systems Review of Systems  Skin:  Positive for rash.   As noted in HPI  Physical Exam Vital Signs  I have reviewed the triage vital signs BP 117/73 (BP Location: Right Arm)   Pulse 89   Temp (!) 97.5 F (36.4 C) (Oral)   Resp 18   Ht 6' (1.829 m)   Wt 96.2 kg   SpO2 100%   BMI 28.75 kg/m  *** Physical Exam  ED Results and Treatments Labs (all labs ordered are listed, but only abnormal results are displayed) Labs Reviewed - No data to display                                                                                                                       EKG  EKG Interpretation Date/Time:    Ventricular Rate:    PR Interval:    QRS Duration:    QT Interval:    QTC Calculation:   R Axis:      Text Interpretation:         Radiology No results found.  Medications Ordered in  ED Medications  bacitracin ointment (has no administration in time range)   Procedures Procedures  (including critical care time) Medical Decision Making / ED  Course   Medical Decision Making Risk OTC drugs.    ***    Final Clinical Impression(s) / ED Diagnoses Final diagnoses:  None    This chart was dictated using voice recognition software.  Despite best efforts to proofread,  errors can occur which can change the documentation meaning.

## 2022-09-23 NOTE — ED Provider Notes (Signed)
[place] EMERGENCY DEPARTMENT AT [facility] Provider Note  CSN: [id number] Arrival date & time: 09/23/22 2211  Chief Complaint(s) Rash  HPI Alvin Anderson is a 42 y.o. male with a past medical history listed below who presents to the emergency department for rash developed while at work.  States that it began approximately 30-60 minutes after putting on his apron.  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  States that he began feeling burning sensation and noted rash develop.  States that other coworkers have developed the same rash.  Denies any other symptoms.    HPI  Past Medical History Past Medical History:  Diagnosis Date   Dislocated knee    Patient Active Problem List   Diagnosis Date Noted   Injury of left Achilles tendon 03/22/2022   Elevated blood pressure reading 03/22/2022   Home Medication(s) Prior to Admission medications   Medication Sig Start Date End Date Taking? Authorizing Provider  bacitracin ointment Apply 1 Application topically 2 (two) times daily. 09/23/22  Yes Jozlin Bently, Amadeo Garnet, MD  celecoxib (CELEBREX) 200 MG capsule Take 1 capsule (200 mg total) by mouth 2 (two) times daily. 07/10/22   Harris, Cammy Copa, PA-C  oxyCODONE (ROXICODONE) 5 MG immediate release tablet Take 0.5-1 tablets (2.5-5 mg total) by mouth every 4 (four) hours as needed for severe pain. 07/10/22   Arthor Captain, PA-C                                                                                                                                    Allergies Amoxicillin  Review of Systems Review of Systems As noted in HPI  Physical Exam Vital Signs  I have reviewed the triage vital signs BP 117/73 (BP Location: Right Arm)   Pulse 89   Temp (!) 97.5 F (36.4 C) (Oral)   Resp 18   Ht 6' (1.829 m)   Wt 96.2 kg   SpO2 100%   BMI 28.75 kg/m   Physical Exam Vitals reviewed.  Constitutional:      General: He is not in acute distress.    Appearance: He is  well-developed. He is not diaphoretic.  HENT:     Head: Normocephalic and atraumatic.     Right Ear: External ear normal.     Left Ear: External ear normal.     Nose: Nose normal.     Mouth/Throat:     Mouth: Mucous membranes are moist.  Eyes:     General: No scleral icterus.       Right eye: No discharge.        Left eye: No discharge.     Conjunctiva/sclera: Conjunctivae normal.     Pupils: Pupils are equal, round, and reactive to light.  Neck:     Trachea: Phonation normal.   Cardiovascular:     Rate and Rhythm: Normal rate and regular rhythm.     Heart sounds:  No murmur heard.    No friction rub. No gallop.  Pulmonary:     Effort: Pulmonary effort is normal. No respiratory distress.     Breath sounds: Normal breath sounds. No stridor. No rales.  Abdominal:     General: There is no distension.     Palpations: Abdomen is soft.     Tenderness: There is no abdominal tenderness.  Musculoskeletal:        General: No tenderness. Normal range of motion.     Cervical back: Normal range of motion and neck supple.  Skin:    General: Skin is warm and dry.     Findings: No erythema or rash.  Neurological:     Mental Status: He is alert and oriented to person, place, and time.  Psychiatric:        Behavior: Behavior normal. ED Results and Treatments Labs (all labs ordered are listed, but only abnormal results are displayed) Labs Reviewed - No data to display                                                                                                                       EKG  EKG Interpretation Date/Time:    Ventricular Rate:    PR Interval:    QRS Duration:    QT Interval:    QTC Calculation:   R Axis:      Text Interpretation:         Radiology No results found.  Medications Ordered in ED Medications  bacitracin ointment (has no administration in time range)   Procedures Procedures  (including critical care time) Medical Decision Making / ED Course    Medical Decision Making Risk OTC drugs.    Presentation is consistent with contact dermatitis likely from detergent/chemical from the apron. No other symptoms or findings concerning for anaphylaxis. Supportive management recommended. Patient provided with bacitracin    Final Clinical Impression(s) / ED Diagnoses Final diagnoses:  Irritant contact dermatitis due to detergent   The patient appears reasonably screened and/or stabilized for discharge and I doubt any other medical condition or other [facility] requiring further screening, evaluation, or treatment in the ED at this time. I have discussed the findings, Dx and Tx plan with the patient/family who expressed understanding and agree(s) with the plan. Discharge instructions discussed at length. The patient/family was given strict return precautions who verbalized understanding of the instructions. No further questions at time of discharge.  Disposition: Discharge  Condition: Good  ED Discharge Orders          Ordered    bacitracin ointment  2 times daily        09/23/22 2317             Follow Up: Primary care provider  Call  to schedule an appointment for close follow up    This chart was dictated using voice recognition software.  Despite best efforts to proofread,  errors can occur which can change the documentation meaning.  Nira Conn, MD 09/23/22 2328

## 2023-10-20 IMAGING — CR DG KNEE COMPLETE 4+V*R*
4 series · 4 of 4 positions shown · non-contrast
Comparison: 07/25/2006

CLINICAL DATA: Right knee pain and swelling.

EXAM:
RIGHT KNEE - COMPLETE 4+ VIEW

[x knee ap right]
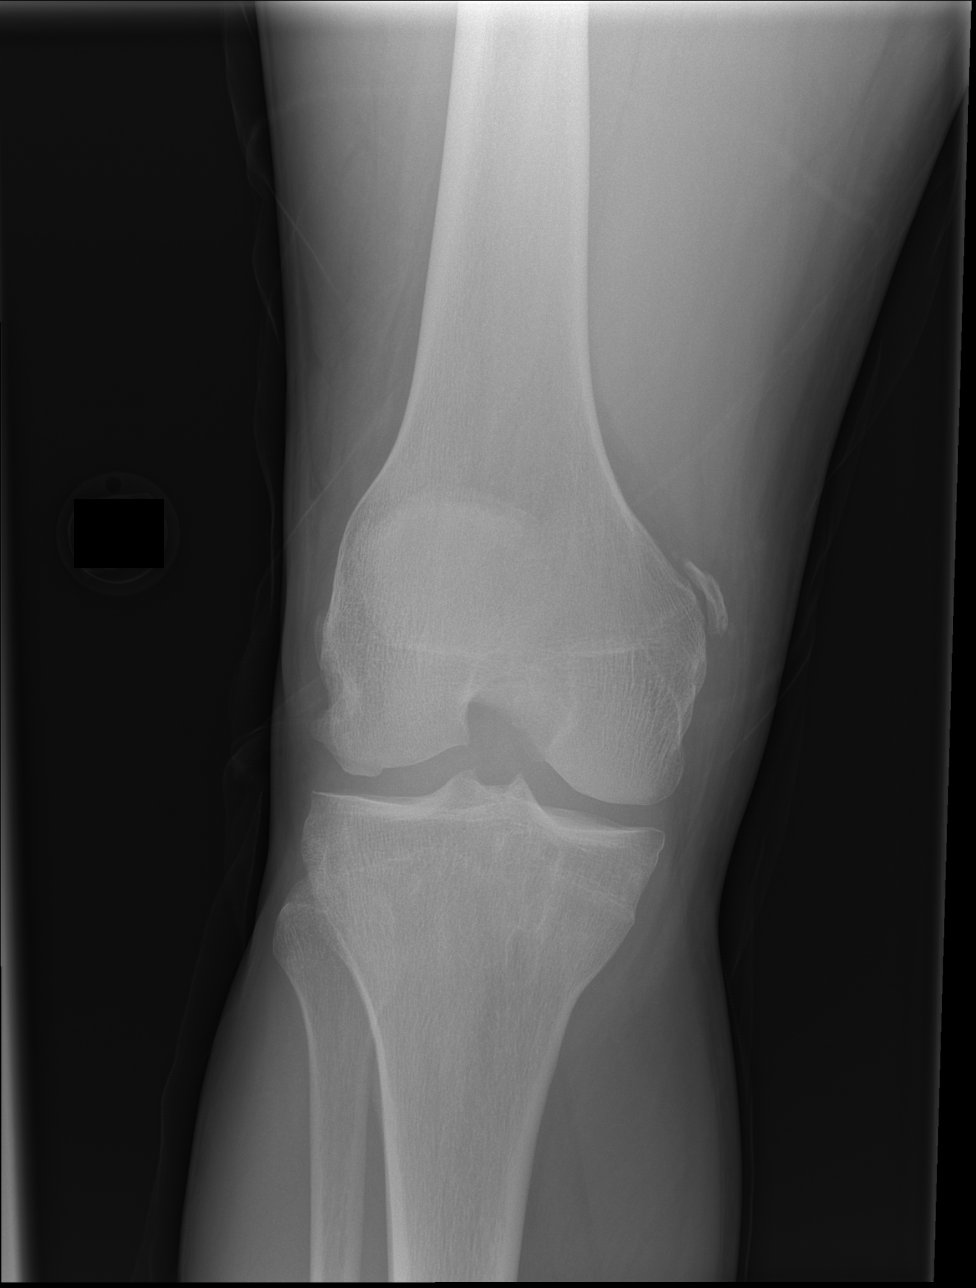

[x knee obl right (1 of 2)]
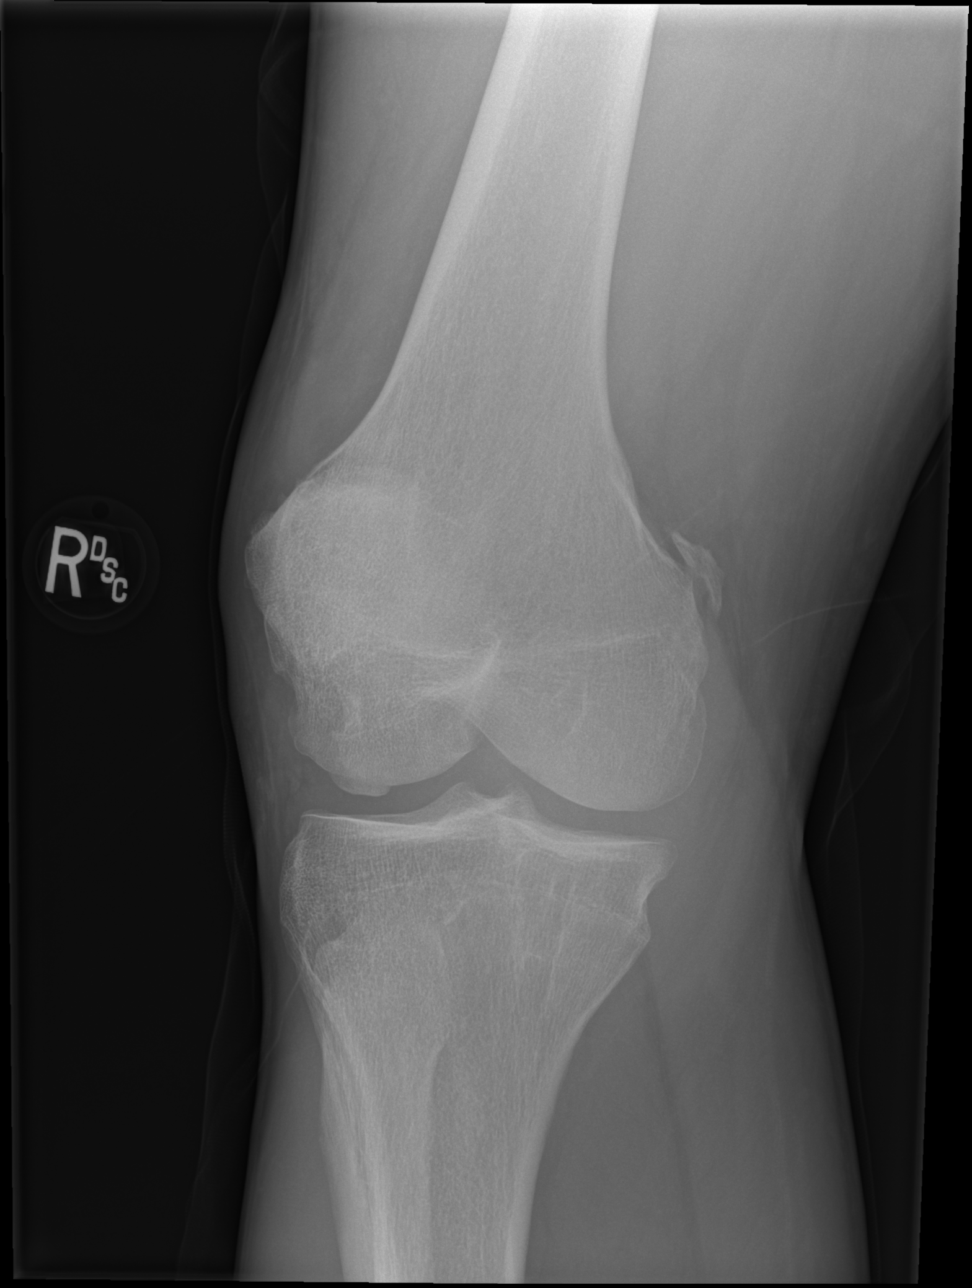

[x knee obl right (2 of 2)]
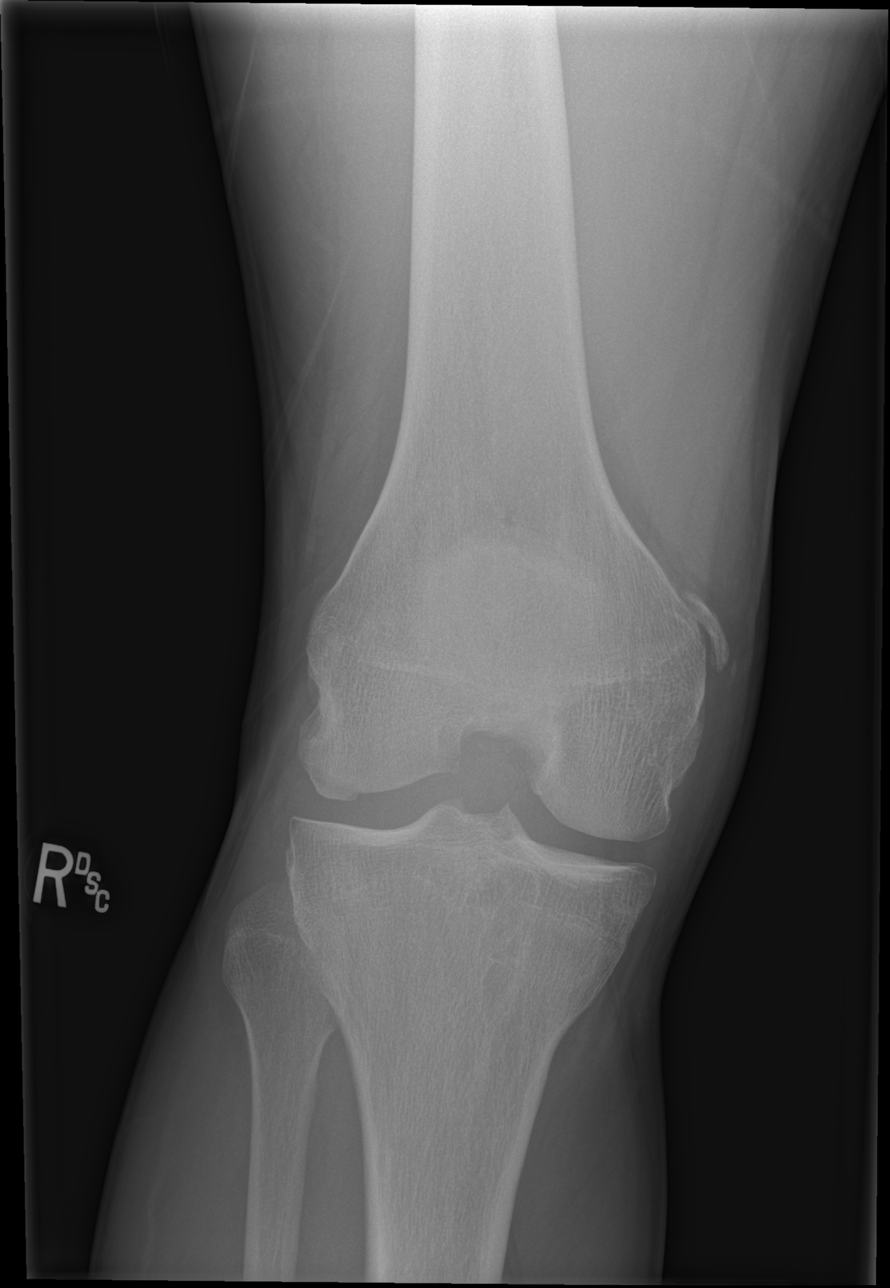

[x knee lat right]
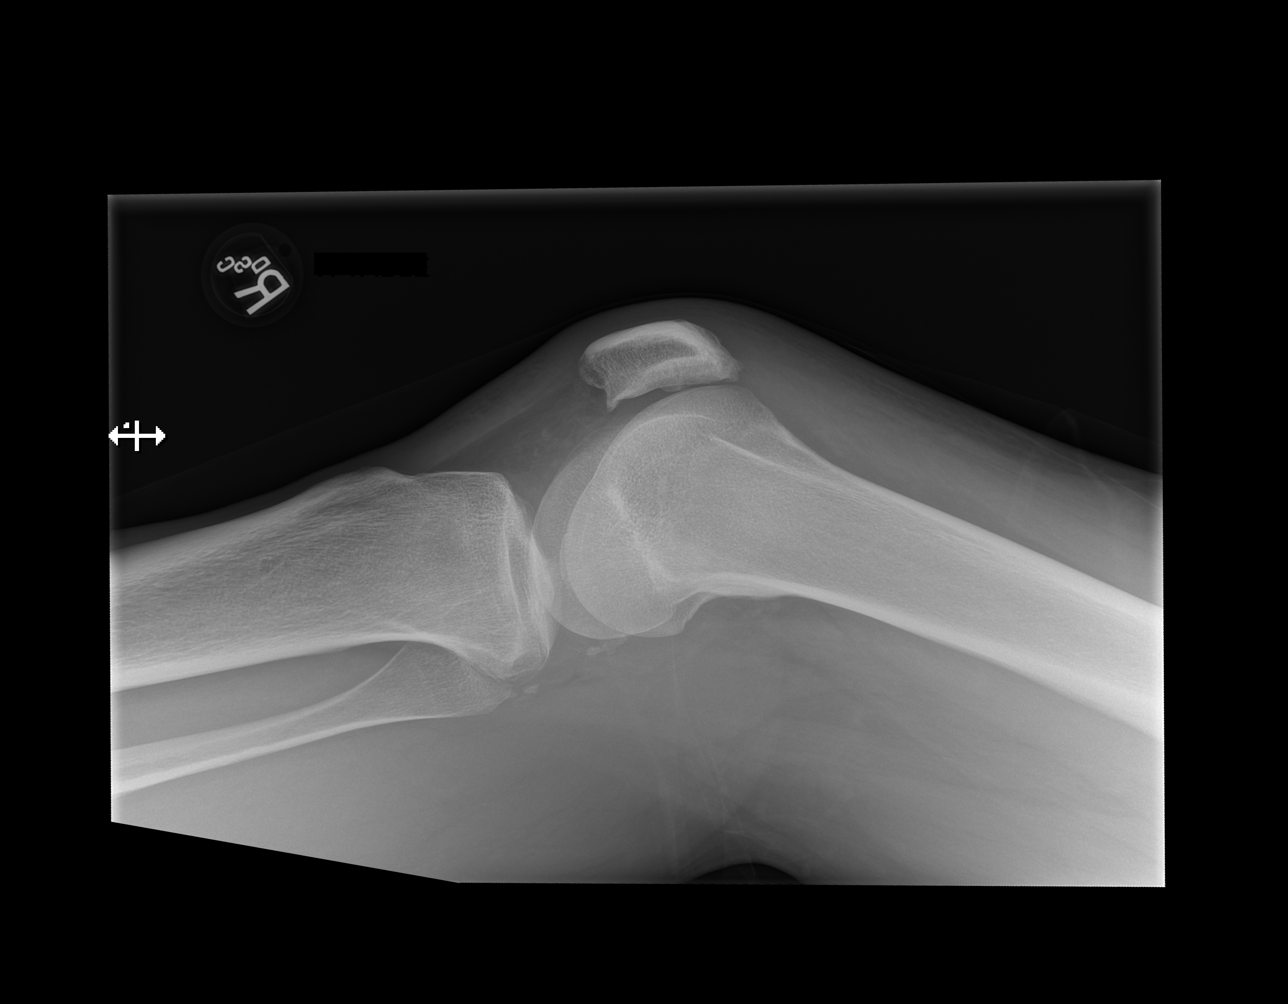

[4 of 4 positions shown; findings below may reference images not displayed]

FINDINGS: No evidence for an acute fracture. No subluxation or dislocation.
Degenerative spurring noted patellofemoral and lateral compartments.
No substantial joint effusion. Sequelae of Pellegrini-Stieda disease
evident.
IMPRESSION: No acute bony abnormality.

## 2024-01-01 ENCOUNTER — Emergency Department (HOSPITAL_COMMUNITY): Payer: Self-pay

## 2024-01-01 ENCOUNTER — Other Ambulatory Visit: Payer: Self-pay

## 2024-01-01 ENCOUNTER — Emergency Department (HOSPITAL_COMMUNITY)
Admission: EM | Admit: 2024-01-01 | Discharge: 2024-01-01 | Disposition: A | Discharge status: Home / Self Care | Payer: Self-pay | Attending: Emergency Medicine | Admitting: Emergency Medicine

## 2024-01-01 DIAGNOSIS — S93402A Sprain of unspecified ligament of left ankle, initial encounter: Secondary | ICD-10-CM | POA: Insufficient documentation

## 2024-01-01 DIAGNOSIS — R030 Elevated blood-pressure reading, without diagnosis of hypertension: Secondary | ICD-10-CM | POA: Insufficient documentation

## 2024-01-01 DIAGNOSIS — W108XXA Fall (on) (from) other stairs and steps, initial encounter: Secondary | ICD-10-CM | POA: Insufficient documentation

## 2024-01-01 NOTE — Discharge Instructions (Addendum)
 Your blood pressure is elevated today.  Please maintain a blood pressure log.  Take this log with you to primary care provider for follow-up.  If you do not have a primary care provider, please contact [facility].  Your x-ray today does not show a broken bone.  Provided with a brace.  You can apply ice for 20 minutes at a time.  Take Motrin  and Tylenol  as needed as directed.  Follow-up with orthopedics in 1 week if not improving.  Weight-bear as tolerated with crutches.

## 2024-01-01 NOTE — ED Provider Notes (Signed)
 [place] EMERGENCY DEPARTMENT AT [facility] Provider Note   CSN: [id number] Arrival date & time: 01/01/24  9592     Patient presents with: Ankle Pain (L)   Alvin Anderson is a 43 y.o. male.   43 year old male with left ankle pain. States he fell down the last few steps 3 days ago and rolled ankle. No other injuries. Pain worse with bearing weight.        Prior to Admission medications   Medication Sig Start Date End Date Taking? Authorizing Provider  bacitracin  ointment Apply 1 Application topically 2 (two) times daily. 09/23/22   Trine Raynell Moder, MD  celecoxib  (CELEBREX ) 200 MG capsule Take 1 capsule (200 mg total) by mouth 2 (two) times daily. 07/10/22   Harris, Abigail, PA-C  oxyCODONE  (ROXICODONE ) 5 MG immediate release tablet Take 0.5-1 tablets (2.5-5 mg total) by mouth every 4 (four) hours as needed for severe pain. 07/10/22   Harris, Abigail, PA-C    Allergies: Amoxicillin    Review of Systems Negative except as per HPI Updated Vital Signs BP (!) 182/133 (BP Location: Right Arm)   Pulse (!) 110   Temp 98.7 F (37.1 C) (Oral)   Resp 16   Ht 6' (1.829 m)   SpO2 100%   BMI 28.75 kg/m   Physical Exam Vitals and nursing note reviewed.  Constitutional:      General: He is not in acute distress.    Appearance: He is well-developed. He is not diaphoretic.  HENT:     Head: Normocephalic and atraumatic.  Cardiovascular:     Pulses: Normal pulses.  Pulmonary:     Effort: Pulmonary effort is normal.  Musculoskeletal:        General: Tenderness present. No swelling.     Left ankle: No swelling, deformity or ecchymosis. Tenderness present over the medial malleolus. No lateral malleolus, base of 5th metatarsal or proximal fibula tenderness. Normal range of motion. Normal pulse.     Left Achilles Tendon: No tenderness.  Skin:    General: Skin is warm and dry.     Findings: No bruising, erythema or rash.  Neurological:     Mental Status: He is  alert and oriented to person, place, and time.     Sensory: No sensory deficit.  Psychiatric:        Behavior: Behavior normal.     (all labs ordered are listed, but only abnormal results are displayed) Labs Reviewed - No data to display  EKG: None  Radiology: DG Ankle Complete Left Result Date: 01/01/2024 CLINICAL DATA:  Fall with pain. EXAM: LEFT ANKLE COMPLETE - 3+ VIEW COMPARISON:  01/27/2022 FINDINGS: There is no evidence of fracture, dislocation, or joint effusion. There is no evidence of significant arthropathy or other focal bone abnormality. Soft tissues are unremarkable. IMPRESSION: Negative. Electronically Signed   By: Camellia Candle M.D.   On: 01/01/2024 05:26     Procedures   Medications Ordered in the ED - No data to display                                  Medical Decision Making  43 year old male with left ankle pain after he rolled his ankle going down the stairs a few days ago.  Found to have pain in the medial ankle.  X-ray of the left ankle as ordered her myself is negative for acute bony abnormality.  Agree  with radiology interpretation.  Patient is provided with ankle ASO, provided with crutches to weight-bear as tolerated..  Advised follow-up with orthopedics in 1 week if not improving.  Also discussed his elevated blood pressure.  Recommend maintain blood pressure log and follow-up with primary care.     Final diagnoses:  Sprain of left ankle, unspecified ligament, initial encounter  Elevated blood pressure reading    ED Discharge Orders     None          Beverley Leita LABOR, PA-C 01/01/24 0600    Palumbo, April, MD 01/01/24 [phone]

## 2024-01-01 NOTE — ED Triage Notes (Signed)
 Pt sts Friday he was running down stairs when his twisted his left ankle. Ambulatory in triage.
